# Patient Record
Sex: Female | Born: 1945 | Race: White | Hispanic: No | Marital: Married | Smoking: Former smoker
Health system: Southern US, Community
[De-identification: ages and names within clinical notes are randomized; demographics above are authoritative.]

## PROBLEM LIST (undated history)

## (undated) DIAGNOSIS — Z974 Presence of external hearing-aid: Secondary | ICD-10-CM

## (undated) DIAGNOSIS — D72820 Lymphocytosis (symptomatic): Secondary | ICD-10-CM

## (undated) DIAGNOSIS — C539 Malignant neoplasm of cervix uteri, unspecified: Secondary | ICD-10-CM

## (undated) HISTORY — PX: COLONOSCOPY: SHX174

## (undated) HISTORY — DX: Lymphocytosis (symptomatic): D72.820

---

## 1979-04-06 DIAGNOSIS — C539 Malignant neoplasm of cervix uteri, unspecified: Secondary | ICD-10-CM

## 1979-04-06 HISTORY — PX: APPENDECTOMY: SHX54

## 1979-04-06 HISTORY — DX: Malignant neoplasm of cervix uteri, unspecified: C53.9

## 1979-09-04 HISTORY — PX: ABDOMINAL HYSTERECTOMY: SHX81

## 2011-04-13 ENCOUNTER — Ambulatory Visit: Payer: Self-pay | Admitting: Obstetrics and Gynecology

## 2011-04-21 ENCOUNTER — Ambulatory Visit: Payer: Self-pay | Admitting: Internal Medicine

## 2011-04-28 ENCOUNTER — Ambulatory Visit: Payer: Self-pay | Admitting: Internal Medicine

## 2011-04-28 LAB — CBC CANCER CENTER
Basophil #: 0.1 x10 3/mm (ref 0.0–0.1)
Eosinophil %: 0.8 %
Lymphocyte #: 10 x10 3/mm — ABNORMAL HIGH (ref 1.0–3.6)
Lymphocyte %: 66.9 %
MCH: 31 pg (ref 26.0–34.0)
MCV: 92 fL (ref 80–100)
Monocyte #: 0.4 x10 3/mm (ref 0.0–0.7)
Monocyte %: 2.9 %
Neutrophil #: 4.3 x10 3/mm (ref 1.4–6.5)
Neutrophil %: 29 %
RBC: 4.54 10*6/uL (ref 3.80–5.20)
RDW: 13.2 % (ref 11.5–14.5)

## 2011-05-07 ENCOUNTER — Ambulatory Visit: Payer: Self-pay | Admitting: Internal Medicine

## 2011-06-07 ENCOUNTER — Ambulatory Visit: Payer: Self-pay | Admitting: Internal Medicine

## 2011-06-07 LAB — CBC CANCER CENTER
Basophil %: 0.4 %
HCT: 41.7 % (ref 35.0–47.0)
Lymphocyte %: 59.5 %
MCH: 31.4 pg (ref 26.0–34.0)
MCHC: 33.9 g/dL (ref 32.0–36.0)
Monocyte #: 0.6 x10 3/mm (ref 0.0–0.7)
Neutrophil #: 5.6 x10 3/mm (ref 1.4–6.5)
Neutrophil %: 35.6 %
RDW: 14.4 % (ref 11.5–14.5)

## 2011-06-08 LAB — PROT IMMUNOELECTROPHORES(ARMC)

## 2011-06-28 ENCOUNTER — Ambulatory Visit: Payer: Self-pay | Admitting: Unknown Physician Specialty

## 2011-07-05 ENCOUNTER — Ambulatory Visit: Payer: Self-pay

## 2011-07-05 ENCOUNTER — Ambulatory Visit: Payer: Self-pay | Admitting: Internal Medicine

## 2011-07-05 LAB — CBC CANCER CENTER
Basophil #: 0.1 x10 3/mm (ref 0.0–0.1)
Basophil %: 0.4 %
Eosinophil %: 1 %
HCT: 38.1 % (ref 35.0–47.0)
HGB: 12.9 g/dL (ref 12.0–16.0)
Lymphocyte #: 9.5 x10 3/mm — ABNORMAL HIGH (ref 1.0–3.6)
MCHC: 33.8 g/dL (ref 32.0–36.0)
MCV: 92 fL (ref 80–100)
Neutrophil %: 35.5 %
Platelet: 331 x10 3/mm (ref 150–440)
RDW: 13.4 % (ref 11.5–14.5)
WBC: 15.9 x10 3/mm — ABNORMAL HIGH (ref 3.6–11.0)

## 2011-08-04 ENCOUNTER — Ambulatory Visit: Payer: Self-pay | Admitting: Internal Medicine

## 2011-09-01 LAB — CBC CANCER CENTER
Basophil %: 0.5 %
Eosinophil #: 0.1 x10 3/mm (ref 0.0–0.7)
Eosinophil %: 0.9 %
HGB: 13.2 g/dL (ref 12.0–16.0)
Lymphocyte #: 8.7 x10 3/mm — ABNORMAL HIGH (ref 1.0–3.6)
Lymphocyte %: 55.9 %
Monocyte %: 3 %
Neutrophil #: 6.2 x10 3/mm (ref 1.4–6.5)
Platelet: 283 x10 3/mm (ref 150–440)
RBC: 4.37 10*6/uL (ref 3.80–5.20)
RDW: 13.4 % (ref 11.5–14.5)
WBC: 15.5 x10 3/mm — ABNORMAL HIGH (ref 3.6–11.0)

## 2011-09-04 ENCOUNTER — Ambulatory Visit: Payer: Self-pay | Admitting: Internal Medicine

## 2011-12-27 ENCOUNTER — Ambulatory Visit: Payer: Self-pay | Admitting: Internal Medicine

## 2011-12-27 LAB — CBC CANCER CENTER
Basophil %: 0.4 %
Eosinophil #: 0.2 x10 3/mm (ref 0.0–0.7)
Eosinophil %: 1.2 %
HGB: 13.5 g/dL (ref 12.0–16.0)
Lymphocyte #: 9.7 x10 3/mm — ABNORMAL HIGH (ref 1.0–3.6)
Lymphocyte %: 59.6 %
MCH: 30.2 pg (ref 26.0–34.0)
MCHC: 32.2 g/dL (ref 32.0–36.0)
MCV: 94 fL (ref 80–100)
Neutrophil #: 5.7 x10 3/mm (ref 1.4–6.5)
Neutrophil %: 35.3 %
Platelet: 272 x10 3/mm (ref 150–440)
RDW: 13.4 % (ref 11.5–14.5)

## 2011-12-27 LAB — CREATININE, SERUM
Creatinine: 0.97 mg/dL (ref 0.60–1.30)
EGFR (African American): >60
EGFR (Non-African Amer.): >60

## 2011-12-27 LAB — LACTATE DEHYDROGENASE: LDH: 179 U/L (ref 81–246)

## 2012-01-04 ENCOUNTER — Ambulatory Visit: Payer: Self-pay | Admitting: Internal Medicine

## 2012-03-06 ENCOUNTER — Ambulatory Visit: Payer: Self-pay | Admitting: Internal Medicine

## 2012-03-06 LAB — CBC CANCER CENTER
Basophil #: 0.1 x10 3/mm (ref 0.0–0.1)
Lymphocyte #: 9 x10 3/mm — ABNORMAL HIGH (ref 1.0–3.6)
Lymphocyte %: 61.4 %
MCH: 30.4 pg (ref 26.0–34.0)
Monocyte %: 2.8 %
Neutrophil #: 5 x10 3/mm (ref 1.4–6.5)
Neutrophil %: 34.3 %
Platelet: 286 x10 3/mm (ref 150–440)
RBC: 4.27 10*6/uL (ref 3.80–5.20)
RDW: 13 % (ref 11.5–14.5)

## 2012-03-06 LAB — CREATININE, SERUM
Creatinine: 0.94 mg/dL (ref 0.60–1.30)
EGFR (African American): >60
EGFR (Non-African Amer.): >60

## 2012-03-06 LAB — URIC ACID: Uric Acid: 3.7 mg/dL (ref 2.6–6.0)

## 2012-03-06 LAB — LACTATE DEHYDROGENASE: LDH: 177 U/L (ref 81–246)

## 2012-04-05 ENCOUNTER — Ambulatory Visit: Payer: Self-pay | Admitting: Internal Medicine

## 2012-06-03 ENCOUNTER — Ambulatory Visit: Payer: Self-pay | Admitting: Internal Medicine

## 2012-06-05 LAB — CBC CANCER CENTER
Eosinophil #: 0.2 x10 3/mm (ref 0.0–0.7)
Eosinophil %: 1.2 %
HCT: 43 % (ref 35.0–47.0)
Lymphocyte %: 63.2 %
MCV: 92 fL (ref 80–100)
Monocyte %: 3.6 %
Neutrophil #: 4.7 x10 3/mm (ref 1.4–6.5)
Neutrophil %: 31.5 %
Platelet: 320 x10 3/mm (ref 150–440)
RBC: 4.66 10*6/uL (ref 3.80–5.20)

## 2012-07-04 ENCOUNTER — Ambulatory Visit: Payer: Self-pay | Admitting: Internal Medicine

## 2012-07-26 ENCOUNTER — Ambulatory Visit: Payer: Self-pay | Admitting: Obstetrics and Gynecology

## 2012-09-04 ENCOUNTER — Ambulatory Visit: Payer: Self-pay | Admitting: Internal Medicine

## 2012-09-04 LAB — CBC CANCER CENTER
Eosinophil #: 0.1 x10 3/mm (ref 0.0–0.7)
Eosinophil %: 0.9 %
HGB: 13.3 g/dL (ref 12.0–16.0)
Lymphocyte #: 8.1 x10 3/mm — ABNORMAL HIGH (ref 1.0–3.6)
MCHC: 33 g/dL (ref 32.0–36.0)
MCV: 92 fL (ref 80–100)
Monocyte #: 0.5 x10 3/mm (ref 0.2–0.9)
Monocyte %: 3.2 %
Neutrophil #: 5.7 x10 3/mm (ref 1.4–6.5)
Platelet: 268 x10 3/mm (ref 150–440)

## 2012-09-04 LAB — URIC ACID: Uric Acid: 3.5 mg/dL (ref 2.6–6.0)

## 2012-09-04 LAB — CREATININE, SERUM
Creatinine: 0.95 mg/dL (ref 0.60–1.30)
EGFR (African American): >60

## 2012-09-04 LAB — LACTATE DEHYDROGENASE: LDH: 193 U/L (ref 81–246)

## 2012-10-03 ENCOUNTER — Ambulatory Visit: Payer: Self-pay | Admitting: Internal Medicine

## 2012-12-25 ENCOUNTER — Ambulatory Visit: Payer: Self-pay | Admitting: Internal Medicine

## 2012-12-25 LAB — CBC CANCER CENTER
Basophil #: 0 x10 3/mm (ref 0.0–0.1)
Basophil %: 0.3 %
Eosinophil #: 0.2 x10 3/mm (ref 0.0–0.7)
HCT: 40.5 % (ref 35.0–47.0)
HGB: 13.4 g/dL (ref 12.0–16.0)
Monocyte #: 0.5 x10 3/mm (ref 0.2–0.9)
Platelet: 288 x10 3/mm (ref 150–440)
WBC: 14.2 x10 3/mm — ABNORMAL HIGH (ref 3.6–11.0)

## 2013-01-03 ENCOUNTER — Ambulatory Visit: Payer: Self-pay | Admitting: Internal Medicine

## 2013-03-05 ENCOUNTER — Ambulatory Visit: Payer: Self-pay | Admitting: Internal Medicine

## 2013-03-05 LAB — CBC CANCER CENTER
Basophil #: 0.1 x10 3/mm (ref 0.0–0.1)
Basophil %: 0.4 %
Eosinophil #: 0.2 x10 3/mm (ref 0.0–0.7)
Eosinophil %: 1.6 %
Lymphocyte #: 8.9 x10 3/mm — ABNORMAL HIGH (ref 1.0–3.6)
MCH: 30.6 pg (ref 26.0–34.0)
MCHC: 33 g/dL (ref 32.0–36.0)
MCV: 93 fL (ref 80–100)
Monocyte #: 0.5 x10 3/mm (ref 0.2–0.9)
Neutrophil #: 5 x10 3/mm (ref 1.4–6.5)
Neutrophil %: 33.9 %
Platelet: 285 x10 3/mm (ref 150–440)
RBC: 4.5 10*6/uL (ref 3.80–5.20)

## 2013-03-05 LAB — CREATININE, SERUM: Creatinine: 0.95 mg/dL (ref 0.60–1.30)

## 2013-03-05 LAB — URIC ACID: Uric Acid: 3.9 mg/dL (ref 2.6–6.0)

## 2013-04-05 ENCOUNTER — Ambulatory Visit: Payer: Self-pay | Admitting: Internal Medicine

## 2013-06-04 ENCOUNTER — Ambulatory Visit: Payer: Self-pay | Admitting: Internal Medicine

## 2013-06-04 LAB — CBC CANCER CENTER
BASOS ABS: 0.1 x10 3/mm (ref 0.0–0.1)
Basophil %: 0.6 %
EOS PCT: 1 %
Eosinophil #: 0.1 x10 3/mm (ref 0.0–0.7)
HCT: 41.2 % (ref 35.0–47.0)
HGB: 13.3 g/dL (ref 12.0–16.0)
Lymphocyte #: 8.8 x10 3/mm — ABNORMAL HIGH (ref 1.0–3.6)
Lymphocyte %: 60.8 %
MCH: 29.9 pg (ref 26.0–34.0)
MCHC: 32.2 g/dL (ref 32.0–36.0)
MCV: 93 fL (ref 80–100)
MONO ABS: 0.5 x10 3/mm (ref 0.2–0.9)
MONOS PCT: 3.5 %
NEUTROS ABS: 4.9 x10 3/mm (ref 1.4–6.5)
Neutrophil %: 34.1 %
Platelet: 307 x10 3/mm (ref 150–440)
RBC: 4.44 10*6/uL (ref 3.80–5.20)
RDW: 13.9 % (ref 11.5–14.5)
WBC: 14.5 x10 3/mm — ABNORMAL HIGH (ref 3.6–11.0)

## 2013-06-04 LAB — CREATININE, SERUM
CREATININE: 0.97 mg/dL (ref 0.60–1.30)
EGFR (Non-African Amer.): 60 — ABNORMAL LOW

## 2013-06-04 LAB — URIC ACID: Uric Acid: 4 mg/dL (ref 2.6–6.0)

## 2013-06-04 LAB — LACTATE DEHYDROGENASE: LDH: 332 U/L — ABNORMAL HIGH (ref 81–246)

## 2013-07-04 ENCOUNTER — Ambulatory Visit: Payer: Self-pay | Admitting: Internal Medicine

## 2013-07-09 LAB — CBC CANCER CENTER
BASOS ABS: 0.1 x10 3/mm (ref 0.0–0.1)
BASOS PCT: 0.5 %
Eosinophil #: 0.1 x10 3/mm (ref 0.0–0.7)
Eosinophil %: 0.8 %
HCT: 42.4 % (ref 35.0–47.0)
HGB: 13.8 g/dL (ref 12.0–16.0)
LYMPHS ABS: 9.9 x10 3/mm — AB (ref 1.0–3.6)
Lymphocyte %: 63 %
MCH: 30.4 pg (ref 26.0–34.0)
MCHC: 32.7 g/dL (ref 32.0–36.0)
MCV: 93 fL (ref 80–100)
Monocyte #: 0.5 x10 3/mm (ref 0.2–0.9)
Monocyte %: 3.1 %
Neutrophil #: 5.1 x10 3/mm (ref 1.4–6.5)
Neutrophil %: 32.6 %
PLATELETS: 302 x10 3/mm (ref 150–440)
RBC: 4.56 10*6/uL (ref 3.80–5.20)
RDW: 13.3 % (ref 11.5–14.5)
WBC: 15.7 x10 3/mm — ABNORMAL HIGH (ref 3.6–11.0)

## 2013-07-09 LAB — LACTATE DEHYDROGENASE: LDH: 175 U/L (ref 81–246)

## 2013-07-09 LAB — CREATININE, SERUM
CREATININE: 0.92 mg/dL (ref 0.60–1.30)
EGFR (Non-African Amer.): >60

## 2013-07-09 LAB — URIC ACID: Uric Acid: 3.6 mg/dL (ref 2.6–6.0)

## 2013-08-03 ENCOUNTER — Ambulatory Visit: Payer: Self-pay | Admitting: Internal Medicine

## 2013-10-08 ENCOUNTER — Ambulatory Visit: Payer: Self-pay | Admitting: Internal Medicine

## 2013-10-08 LAB — CREATININE, SERUM
Creatinine: 0.88 mg/dL (ref 0.60–1.30)
EGFR (Non-African Amer.): >60

## 2013-10-08 LAB — CBC CANCER CENTER
Basophil #: 0.1 x10 3/mm (ref 0.0–0.1)
Basophil %: 0.5 %
Eosinophil #: 0.1 x10 3/mm (ref 0.0–0.7)
Eosinophil %: 0.6 %
HCT: 39.5 % (ref 35.0–47.0)
HGB: 13 g/dL (ref 12.0–16.0)
LYMPHS ABS: 7.3 x10 3/mm — AB (ref 1.0–3.6)
Lymphocyte %: 55.3 %
MCH: 30.6 pg (ref 26.0–34.0)
MCHC: 32.8 g/dL (ref 32.0–36.0)
MCV: 93 fL (ref 80–100)
MONOS PCT: 3.5 %
Monocyte #: 0.5 x10 3/mm (ref 0.2–0.9)
NEUTROS PCT: 40.1 %
Neutrophil #: 5.3 x10 3/mm (ref 1.4–6.5)
PLATELETS: 271 x10 3/mm (ref 150–440)
RBC: 4.24 10*6/uL (ref 3.80–5.20)
RDW: 13.4 % (ref 11.5–14.5)
WBC: 13.1 x10 3/mm — ABNORMAL HIGH (ref 3.6–11.0)

## 2013-10-08 LAB — URIC ACID: URIC ACID: 4.1 mg/dL (ref 2.6–6.0)

## 2013-10-08 LAB — LACTATE DEHYDROGENASE: LDH: 178 U/L (ref 81–246)

## 2013-11-03 ENCOUNTER — Ambulatory Visit: Payer: Self-pay | Admitting: Internal Medicine

## 2013-12-04 ENCOUNTER — Ambulatory Visit: Payer: Self-pay | Admitting: Internal Medicine

## 2014-02-18 ENCOUNTER — Ambulatory Visit: Payer: Self-pay | Admitting: Internal Medicine

## 2014-02-18 LAB — URIC ACID: URIC ACID: 3.4 mg/dL (ref 2.6–6.0)

## 2014-02-18 LAB — LACTATE DEHYDROGENASE: LDH: 162 U/L (ref 81–246)

## 2014-02-18 LAB — CBC CANCER CENTER
Basophil #: 0.1 x10 3/mm (ref 0.0–0.1)
Basophil %: 0.6 %
EOS PCT: 1.2 %
Eosinophil #: 0.2 x10 3/mm (ref 0.0–0.7)
HCT: 43.4 % (ref 35.0–47.0)
HGB: 14.1 g/dL (ref 12.0–16.0)
Lymphocyte #: 8.4 x10 3/mm — ABNORMAL HIGH (ref 1.0–3.6)
Lymphocyte %: 59.8 %
MCH: 30.7 pg (ref 26.0–34.0)
MCHC: 32.5 g/dL (ref 32.0–36.0)
MCV: 95 fL (ref 80–100)
Monocyte #: 0.6 x10 3/mm (ref 0.2–0.9)
Monocyte %: 4.5 %
NEUTROS PCT: 33.9 %
Neutrophil #: 4.8 x10 3/mm (ref 1.4–6.5)
Platelet: 280 x10 3/mm (ref 150–440)
RBC: 4.59 10*6/uL (ref 3.80–5.20)
RDW: 13.1 % (ref 11.5–14.5)
WBC: 14.1 x10 3/mm — ABNORMAL HIGH (ref 3.6–11.0)

## 2014-02-18 LAB — CREATININE, SERUM
Creatinine: 0.81 mg/dL (ref 0.60–1.30)
EGFR (Non-African Amer.): >60

## 2014-03-05 ENCOUNTER — Ambulatory Visit: Payer: Self-pay | Admitting: Internal Medicine

## 2014-06-10 ENCOUNTER — Ambulatory Visit
Admit: 2014-06-10 | Disposition: A | Discharge status: Home / Self Care | Payer: Self-pay | Attending: Internal Medicine | Admitting: Internal Medicine

## 2014-07-05 ENCOUNTER — Ambulatory Visit
Admit: 2014-07-05 | Disposition: A | Discharge status: Home / Self Care | Payer: Self-pay | Attending: Internal Medicine | Admitting: Internal Medicine

## 2014-07-19 ENCOUNTER — Other Ambulatory Visit: Payer: Self-pay | Admitting: Internal Medicine

## 2014-07-19 DIAGNOSIS — Z1231 Encounter for screening mammogram for malignant neoplasm of breast: Secondary | ICD-10-CM

## 2014-10-09 ENCOUNTER — Other Ambulatory Visit: Payer: Self-pay

## 2014-10-09 DIAGNOSIS — C9110 Chronic lymphocytic leukemia of B-cell type not having achieved remission: Secondary | ICD-10-CM | POA: Insufficient documentation

## 2014-10-10 ENCOUNTER — Inpatient Hospital Stay: Payer: Medicare Other | Attending: Family Medicine

## 2014-10-10 DIAGNOSIS — C9110 Chronic lymphocytic leukemia of B-cell type not having achieved remission: Secondary | ICD-10-CM | POA: Insufficient documentation

## 2014-10-10 DIAGNOSIS — D72829 Elevated white blood cell count, unspecified: Secondary | ICD-10-CM | POA: Insufficient documentation

## 2014-10-11 ENCOUNTER — Inpatient Hospital Stay: Payer: Medicare Other

## 2014-10-11 DIAGNOSIS — D72829 Elevated white blood cell count, unspecified: Secondary | ICD-10-CM | POA: Diagnosis not present

## 2014-10-11 DIAGNOSIS — C9110 Chronic lymphocytic leukemia of B-cell type not having achieved remission: Secondary | ICD-10-CM | POA: Diagnosis present

## 2014-10-11 LAB — CBC
HEMATOCRIT: 42.6 % (ref 35.0–47.0)
Hemoglobin: 14 g/dL (ref 12.0–16.0)
MCH: 30.2 pg (ref 26.0–34.0)
MCHC: 32.9 g/dL (ref 32.0–36.0)
MCV: 91.8 fL (ref 80.0–100.0)
PLATELETS: 286 10*3/uL (ref 150–440)
RBC: 4.64 MIL/uL (ref 3.80–5.20)
RDW: 13.3 % (ref 11.5–14.5)
WBC: 13.7 10*3/uL — ABNORMAL HIGH (ref 3.6–11.0)

## 2014-10-11 LAB — LACTATE DEHYDROGENASE: LDH: 156 U/L (ref 98–192)

## 2014-10-11 LAB — CREATININE, SERUM
Creatinine, Ser: 0.87 mg/dL (ref 0.44–1.00)
GFR calc Af Amer: >60 mL/min (ref >60)

## 2014-11-08 ENCOUNTER — Encounter (HOSPITAL_COMMUNITY): Payer: Self-pay

## 2014-11-08 ENCOUNTER — Ambulatory Visit
Admission: RE | Admit: 2014-11-08 | Discharge: 2014-11-08 | Disposition: A | Discharge status: Home / Self Care | Payer: Medicare Other | Source: Ambulatory Visit | Attending: Internal Medicine | Admitting: Internal Medicine

## 2014-11-08 ENCOUNTER — Other Ambulatory Visit: Payer: Self-pay | Admitting: Internal Medicine

## 2014-11-08 DIAGNOSIS — Z1231 Encounter for screening mammogram for malignant neoplasm of breast: Secondary | ICD-10-CM | POA: Diagnosis not present

## 2014-11-08 HISTORY — DX: Malignant neoplasm of cervix uteri, unspecified: C53.9

## 2015-02-17 ENCOUNTER — Inpatient Hospital Stay: Payer: Medicare Other | Attending: Internal Medicine | Admitting: Internal Medicine

## 2015-02-17 ENCOUNTER — Ambulatory Visit: Payer: Self-pay | Admitting: Internal Medicine

## 2015-02-17 ENCOUNTER — Other Ambulatory Visit: Payer: Self-pay

## 2015-02-17 ENCOUNTER — Inpatient Hospital Stay: Payer: Medicare Other | Admitting: *Deleted

## 2015-02-17 VITALS — BP 121/82 | HR 69 | Temp 97.4°F | Resp 18 | Ht 63.78 in | Wt 132.3 lb

## 2015-02-17 DIAGNOSIS — C9110 Chronic lymphocytic leukemia of B-cell type not having achieved remission: Secondary | ICD-10-CM | POA: Insufficient documentation

## 2015-02-17 DIAGNOSIS — Z79899 Other long term (current) drug therapy: Secondary | ICD-10-CM | POA: Diagnosis not present

## 2015-02-17 DIAGNOSIS — Z87891 Personal history of nicotine dependence: Secondary | ICD-10-CM | POA: Diagnosis not present

## 2015-02-17 DIAGNOSIS — Z8541 Personal history of malignant neoplasm of cervix uteri: Secondary | ICD-10-CM | POA: Diagnosis not present

## 2015-02-17 DIAGNOSIS — Z23 Encounter for immunization: Secondary | ICD-10-CM | POA: Diagnosis not present

## 2015-02-17 LAB — CBC WITH DIFFERENTIAL/PLATELET
Basophils Absolute: 0.1 10*3/uL (ref 0–0.1)
Basophils Relative: 1 %
EOS PCT: 2 %
Eosinophils Absolute: 0.2 10*3/uL (ref 0–0.7)
HCT: 42.6 % (ref 35.0–47.0)
Hemoglobin: 14 g/dL (ref 12.0–16.0)
LYMPHS PCT: 62 %
Lymphs Abs: 8.4 10*3/uL — ABNORMAL HIGH (ref 1.0–3.6)
MCH: 30.3 pg (ref 26.0–34.0)
MCHC: 32.9 g/dL (ref 32.0–36.0)
MCV: 92.1 fL (ref 80.0–100.0)
MONO ABS: 0.4 10*3/uL (ref 0.2–0.9)
MONOS PCT: 3 %
Neutro Abs: 4.3 10*3/uL (ref 1.4–6.5)
Neutrophils Relative %: 32 %
PLATELETS: 308 10*3/uL (ref 150–440)
RBC: 4.63 MIL/uL (ref 3.80–5.20)
RDW: 13.5 % (ref 11.5–14.5)
WBC: 13.5 10*3/uL — AB (ref 3.6–11.0)

## 2015-02-17 LAB — LACTATE DEHYDROGENASE: LDH: 146 U/L (ref 98–192)

## 2015-02-17 LAB — CREATININE, SERUM
Creatinine, Ser: 1.12 mg/dL — ABNORMAL HIGH (ref 0.44–1.00)
GFR calc Af Amer: 56 mL/min — ABNORMAL LOW (ref >60)
GFR calc non Af Amer: 48 mL/min — ABNORMAL LOW (ref >60)

## 2015-02-17 LAB — URIC ACID: Uric Acid, Serum: 5.4 mg/dL (ref 2.3–6.6)

## 2015-02-17 MED ORDER — INFLUENZA VAC SPLIT QUAD 0.5 ML IM SUSY
0.5000 mL | PREFILLED_SYRINGE | Freq: Once | INTRAMUSCULAR | Status: AC
  Administered 2015-02-17: 0.5 mL via INTRAMUSCULAR
  Filled 2015-02-17: qty 0.5

## 2015-02-17 NOTE — Progress Notes (Signed)
[place] @ [facility] Telephone:(336) [phone]  Fax:(336) [place] OB: 06/20/1945  MR#: [id number]  OEH#:[id number]  Patient Care Team: Madelyn Brunner, MD as PCP - General (Internal Medicine)  CHIEF COMPLAINT: Rai Stage 0 chronic lymphocytic leukemia  Chief Complaint  Patient presents with  . lymphocytosis   HPI: Ms. Suzanne Hogan was found to have elevated white blood cell count in 2013. Further workup revealed presence of monoclonal B- cell  population on flow cytometry. Patient was diagnosed with stage 0 CLL in the absence of palpable lymphadenopathy, hepatosplenomegaly or anemia and thrombocytopenia. She has been on observation since then.   INTERVAL HISTORY:  Suzanne Hogan returns for a follow-up appointment. Since her last appointment she has felt fairly well, but has been feeling somewhat less active, and underwent to do much activities. She, however, denies chest pain, night sweats, shortness of breath, decreased appetite, nausea, vomiting, diarrhea, constipation, suicidal homicidal ideations.   REVIEW OF SYSTEMS:    As per HPI. Otherwise, a complete review of systems is negatve.  PAST MEDICAL HISTORY: Past Medical History  Diagnosis Date  . Cervical cancer ([place]) 1981  . Lymphocytosis     PAST SURGICAL HISTORY: Past Surgical History  Procedure Laterality Date  . Abdominal hysterectomy  09/1979    [facility]    FAMILY HISTORY History reviewed. No pertinent family history.  ADVANCED DIRECTIVES:  No flowsheet data found.  HEALTH MAINTENANCE: Social History  Substance Use Topics  . Smoking status: Former Smoker -- 10 years    Quit date: 06/02/2002  . Smokeless tobacco: Never Used  . Alcohol Use: Yes     Comment: 1 GLASS  5 TIMES A WEEK      Allergies  Allergen Reactions  . Codeine Diarrhea and Nausea And Vomiting  . Sulfa Antibiotics Rash    Current Outpatient Prescriptions  Medication Sig Dispense Refill  . Calcium  Carb-Cholecalciferol (CALCIUM + D3 PO) Take 1 Dose by mouth 2 (two) times daily. 900 mg AND VITAMIN D 1200. 1 IN AMD AN 1/2 IN PM    . cholecalciferol (VITAMIN D) 1000 UNITS tablet Take 1,000 Units by mouth daily.    . Multiple Vitamins-Minerals (MULTIVITAMIN WITH MINERALS) tablet Take 1 tablet by mouth daily.    . Omega 3 1200 MG CAPS Take 1 capsule by mouth.    . vitamin C (ASCORBIC ACID) 500 MG tablet Take 500 mg by mouth daily.    . Zinc 25 MG TABS Take 1 tablet by mouth.     No current facility-administered medications for this visit.    OBJECTIVE:  Filed Vitals:   02/17/15 1459  BP: 121/82  Pulse: 69  Temp: 97.4 F (36.3 C)  Resp: 18     Body mass index is 22.86 kg/(m^2).    ECOG FS:1 - Symptomatic but completely ambulatory  PHYSICAL EXAM: BP 121/82 mmHg  Pulse 69  Temp(Src) 97.4 F (36.3 C) (Tympanic)  Resp 18  Ht 5' 3.78" (1.62 m)  Wt 132 lb 4.4 oz (60 kg)  BMI 22.86 kg/m2  General Appearance:    Alert, cooperative, no distress, appears  younger than stated age 69 female with normal affect and stable mood   Head:    Normocephalic, without obvious abnormality, atraumatic  Eyes:    PERRL, conjunctiva/corneas clear, EOM's intact, fundi    benign, both eyes  Ears:    Normal TM's and external ear canals, both ears  Nose:   Nares  normal, septum midline, mucosa normal, no drainage    or sinus tenderness  Throat:   Lips, mucosa, and tongue normal; teeth and gums normal  Neck:   Supple, symmetrical, trachea midline, no adenopathy;    thyroid:  no enlargement/tenderness/nodules; no carotid   bruit or JVD  Back:     Symmetric, no curvature, ROM normal, no CVA tenderness  Lungs:     Clear to auscultation bilaterally, respirations unlabored  Chest Wall:    No tenderness or deformity   Heart:    Regular rate and rhythm, S1 and S2 normal, no murmur, rub   or gallop  Breast Exam:    No tenderness, masses, or nipple abnormality  Abdomen:     Soft, non-tender, bowel  sounds active all four quadrants,    no masses, no organomegaly     Rectal:    deferred by the patient   Extremities:   Extremities normal, atraumatic, no cyanosis or edema  Pulses:   2+ and symmetric all extremities  Skin:   Skin color, texture, turgor normal, no rashes or lesions  Lymph nodes:   Cervical, supraclavicular, and axillary nodes normal  Neurologic:   CNII-XII intact, normal strength, sensation and reflexes    throughout      LAB RESULTS:  CBC Latest Ref Rng 02/17/2015 10/11/2014  WBC 3.6 - 11.0 K/uL 13.5(H) 13.7(H)  Hemoglobin 12.0 - 16.0 g/dL 14.0 14.0  Hematocrit 35.0 - 47.0 % 42.6 42.6  Platelets 150 - 440 K/uL 308 286    Clinical Support on 02/17/2015  Component Date Value Ref Range Status  . Uric Acid, Serum 02/17/2015 5.4  2.3 - 6.6 mg/dL Final  . WBC 02/17/2015 13.5* 3.6 - 11.0 K/uL Final  . RBC 02/17/2015 4.63  3.80 - 5.20 MIL/uL Final  . Hemoglobin 02/17/2015 14.0  12.0 - 16.0 g/dL Final  . HCT 02/17/2015 42.6  35.0 - 47.0 % Final  . MCV 02/17/2015 92.1  80.0 - 100.0 fL Final  . MCH 02/17/2015 30.3  26.0 - 34.0 pg Final  . MCHC 02/17/2015 32.9  32.0 - 36.0 g/dL Final  . RDW 02/17/2015 13.5  11.5 - 14.5 % Final  . Platelets 02/17/2015 308  150 - 440 K/uL Final  . Neutrophils Relative % 02/17/2015 32   Final  . Neutro Abs 02/17/2015 4.3  1.4 - 6.5 K/uL Final  . Lymphocytes Relative 02/17/2015 62   Final  . Lymphs Abs 02/17/2015 8.4* 1.0 - 3.6 K/uL Final  . Monocytes Relative 02/17/2015 3   Final  . Monocytes Absolute 02/17/2015 0.4  0.2 - 0.9 K/uL Final  . Eosinophils Relative 02/17/2015 2   Final  . Eosinophils Absolute 02/17/2015 0.2  0 - 0.7 K/uL Final  . Basophils Relative 02/17/2015 1   Final  . Basophils Absolute 02/17/2015 0.1  0 - 0.1 K/uL Final  . Creatinine, Ser 02/17/2015 1.12* 0.44 - 1.00 mg/dL Final  . GFR calc non Af Amer 02/17/2015 48* >60 mL/min Final  . GFR calc Af Amer 02/17/2015 56* >60 mL/min Final   Comment: (NOTE) The eGFR has  been calculated using the CKD EPI equation. This calculation has not been validated in all clinical situations. eGFR's persistently <60 mL/min signify possible Chronic Kidney Disease.   Marland Kitchen LDH 02/17/2015 146  98 - 192 U/L Final    STUDIES: No results found.  ASSESSMENT AND PLAN: 1. Rai stage 0 chronic lymphocytic leukemia-we had an extensive discussion with the patient regarding the significance of the diagnosis,  along with the expectations regarding clinical course, expected complications and outcomes. According to the original publication of Rai stage and system patients with stage 0 disease had median survival of 150 months. In order to clearly establish perceived excellent prognosis for this patient we will request peripheral blood FISH panel to be performed, which will help [place] clearly establish the important prognostic features of her disease. However, it appears that over the past 3 years her white blood cell count has remained remarkably stable, and absolute lymphocyte count has not changed, which likely indicates a very low risk disease. As such, we agreed that at this point there is no reason to perform frequent CBC checks and clinical exams, but rather repeat blood counts every 6 months and see the patient on an annual basis. She, however, was advised to perform self exam once a month to look for enlarged lymph nodes. We will also check quantitative immunoglobulin levels to ensure that there is no potential for increased risk of bacterial infections due to possible hypogammaglobulinemia. 2. Decreased energy level-there could be several reasons for such change in her condition, including thyroid insufficiency, depression, chronic illness. She will discuss this issues with her primary care physician. At this time there is no sign of serious somatic illness, and patient does not endorse any signs of depression. 3. Health maintenance-patient is up-to-date with colonoscopy, mammogram. We will  administer flu vaccine today in the clinic.   Patient expressed understanding and was in agreement with this plan. She also understands that She can call clinic at any time with any questions, concerns, or complaints.    No matching staging information was found for the patient.  Roxana Hires, MD   02/17/2015 4:00 PM

## 2015-02-17 NOTE — Progress Notes (Signed)
PT STATES THAT SHE DOES NOT FEEL MUCH DIFFERENT SHE JUST DOES NOT HAVE THE DESIRE TO DO ACTIVITY LIKE CLEANING.  BUT SHE IS RETIRED, HAS NO ROUTINE THAT SHE FOLLOWS.  SHE DOES MENTION THAT SHE STOPPED HER PREMARIN LAST AUG.

## 2015-02-21 LAB — IGG, IGA, IGM
IGA: 175 mg/dL (ref 64–422)
IGG (IMMUNOGLOBIN G), SERUM: 838 mg/dL (ref 700–1600)
IgM, Serum: 139 mg/dL (ref 26–217)

## 2015-02-21 LAB — FISH HES LEUKEMIA, 4Q12 REA

## 2015-03-28 ENCOUNTER — Telehealth: Payer: Self-pay | Admitting: *Deleted

## 2015-03-28 NOTE — Telephone Encounter (Signed)
Pt had called to get her genetic results.  I checked her FISH panel and it showed CLL which she already knew but the md wanted to check it to see what deletion it was so he can determine if she had an aggressive kind.  The pt. 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  It has the best longest survival of any of the rest of the deletions.  I have called her on the cell phone that she left for [place] but there was no answer. I left the message that it was the best prognostic deletion for CLL and she should have a long life per Dr. Rudean Hitt.I also left message that she can call me back and ask questions and that I have mailed her a copy of results.

## 2015-08-18 ENCOUNTER — Inpatient Hospital Stay: Payer: Medicare Other

## 2015-08-18 ENCOUNTER — Inpatient Hospital Stay: Payer: Medicare Other | Attending: Family Medicine | Admitting: Family Medicine

## 2015-08-18 VITALS — BP 120/79 | HR 80 | Temp 96.8°F | Resp 18 | Wt 130.3 lb

## 2015-08-18 DIAGNOSIS — C9110 Chronic lymphocytic leukemia of B-cell type not having achieved remission: Secondary | ICD-10-CM

## 2015-08-18 DIAGNOSIS — C9111 Chronic lymphocytic leukemia of B-cell type in remission: Secondary | ICD-10-CM | POA: Diagnosis present

## 2015-08-18 DIAGNOSIS — Z87891 Personal history of nicotine dependence: Secondary | ICD-10-CM | POA: Diagnosis not present

## 2015-08-18 DIAGNOSIS — Z8541 Personal history of malignant neoplasm of cervix uteri: Secondary | ICD-10-CM | POA: Diagnosis not present

## 2015-08-18 DIAGNOSIS — Z79899 Other long term (current) drug therapy: Secondary | ICD-10-CM

## 2015-08-18 LAB — CBC WITH DIFFERENTIAL/PLATELET
BASOS PCT: 1 %
Basophils Absolute: 0.1 10*3/uL (ref 0–0.1)
EOS ABS: 0.1 10*3/uL (ref 0–0.7)
EOS PCT: 1 %
HCT: 40.7 % (ref 35.0–47.0)
HEMOGLOBIN: 13.7 g/dL (ref 12.0–16.0)
LYMPHS ABS: 9.1 10*3/uL — AB (ref 1.0–3.6)
Lymphocytes Relative: 60 %
MCH: 30.7 pg (ref 26.0–34.0)
MCHC: 33.6 g/dL (ref 32.0–36.0)
MCV: 91.3 fL (ref 80.0–100.0)
MONOS PCT: 3 %
Monocytes Absolute: 0.4 10*3/uL (ref 0.2–0.9)
NEUTROS PCT: 35 %
Neutro Abs: 5.2 10*3/uL (ref 1.4–6.5)
Platelets: 286 10*3/uL (ref 150–440)
RBC: 4.46 MIL/uL (ref 3.80–5.20)
RDW: 13.7 % (ref 11.5–14.5)
WBC: 14.9 10*3/uL — AB (ref 3.6–11.0)

## 2015-08-18 NOTE — Progress Notes (Signed)
Offers no complaints. States is feeling well. 

## 2015-08-18 NOTE — Progress Notes (Signed)
[place] @ [facility] Telephone:(336) [phone]  Fax:(336) [place] OB: [birth year]  MR#: [id number]  [id number]  Patient Care Team: Madelyn Brunner, MD as PCP - General (Internal Medicine)  CHIEF COMPLAINT: Rai Stage 0 chronic lymphocytic leukemia  Chief Complaint  Patient presents with  . CLL   HPI: Ms. Suzanne Hogan was found to have elevated white blood cell count in 2013. Further workup revealed presence of monoclonal B- cell  population on flow cytometry. Patient was diagnosed with stage 0 CLL in the absence of palpable lymphadenopathy, hepatosplenomegaly or anemia and thrombocytopenia. She has been on observation since then.   INTERVAL HISTORY:  She is here for further evaluation regarding CLL. Patient was previously seen by Dr. Inez Pilgrim, then Dr Rudean Hitt. Since her last appointment she has felt fairly well. She denies chest pain, night sweats, shortness of breath, decreased appetite, nausea, vomiting, diarrhea, constipation, or any lumps, bumps or unusual nodules. She continues with routine evaluation with PCP. Overall she is feeling very well and denies any acute complaints.  REVIEW OF SYSTEMS:   As per HPI. Otherwise, a complete review of systems is negatve.  PAST MEDICAL HISTORY: Past Medical History  Diagnosis Date  . Cervical cancer ([place]) 1981  . Lymphocytosis     PAST SURGICAL HISTORY: Past Surgical History  Procedure Laterality Date  . Abdominal hysterectomy  09/1979    [facility]    FAMILY HISTORY Family History  Problem Relation Age of Onset  . Cancer Mother     ADVANCED DIRECTIVES:  No flowsheet data found.  HEALTH MAINTENANCE: Social History  Substance Use Topics  . Smoking status: Former Smoker -- 10 years    Quit date: 06/02/2002  . Smokeless tobacco: Never Used  . Alcohol Use: Yes     Comment: 1 GLASS  5 TIMES A WEEK      Allergies  Allergen Reactions  . Codeine Diarrhea and Nausea And Vomiting  . Sulfa  Antibiotics Rash    Current Outpatient Prescriptions  Medication Sig Dispense Refill  . Calcium Carb-Cholecalciferol (CALCIUM + D3 PO) Take 1 Dose by mouth 2 (two) times daily. 900 mg AND VITAMIN D 1200. 1 IN AMD AN 1/2 IN PM    . cholecalciferol (VITAMIN D) 1000 UNITS tablet Take 1,000 Units by mouth daily.    . Multiple Vitamins-Minerals (MULTIVITAMIN WITH MINERALS) tablet Take 1 tablet by mouth daily.    . Omega 3 1200 MG CAPS Take 1 capsule by mouth.    . vitamin C (ASCORBIC ACID) 500 MG tablet Take 500 mg by mouth daily.    . Zinc 25 MG TABS Take 1 tablet by mouth.     No current facility-administered medications for this visit.    OBJECTIVE:  Filed Vitals:   08/18/15 1516  BP: 120/79  Pulse: 80  Temp: 96.8 F (36 C)  Resp: 18     Body mass index is 22.52 kg/(m^2).    ECOG FS:1 - Symptomatic but completely ambulatory  PHYSICAL EXAM: BP 120/79 mmHg  Pulse 80  Temp(Src) 96.8 F (36 C) (Tympanic)  Resp 18  Wt 130 lb 4.7 oz (59.1 kg)  General Appearance:    Alert, cooperative, no distress, appears  younger than stated age 4 female with normal affect and stable mood   Head:    Normocephalic, without obvious abnormality, atraumatic  Eyes:    PERRL, conjunctiva/corneas clear, EOM's intact, fundi    benign, both eyes  Ears:  Normal TM's and external ear canals, both ears  Nose:   Nares normal, septum midline, mucosa normal, no drainage    or sinus tenderness  Throat:   Lips, mucosa, and tongue normal; teeth and gums normal  Neck:   Supple, symmetrical, trachea midline, no adenopathy;    thyroid:  no enlargement/tenderness/nodules; no carotid   bruit or JVD  Back:     Symmetric, no curvature, ROM normal, no CVA tenderness  Lungs:     Clear to auscultation bilaterally, respirations unlabored  Chest Wall:    No tenderness or deformity   Heart:    Regular rate and rhythm, S1 and S2 normal, no murmur, rub   or gallop  Breast Exam:    No tenderness, masses, or  nipple abnormality  Abdomen:     Soft, non-tender, bowel sounds active all four quadrants,    no masses, no organomegaly     Rectal:    deferred by the patient   Extremities:   Extremities normal, atraumatic, no cyanosis or edema  Pulses:   2+ and symmetric all extremities  Skin:   Skin color, texture, turgor normal, no rashes or lesions  Lymph nodes:   Cervical, supraclavicular, and axillary nodes normal  Neurologic:   CNII-XII intact, normal strength, sensation and reflexes    throughout      LAB RESULTS:  CBC Latest Ref Rng 08/18/2015 02/17/2015  WBC 3.6 - 11.0 K/uL 14.9(H) 13.5(H)  Hemoglobin 12.0 - 16.0 g/dL 13.7 14.0  Hematocrit 35.0 - 47.0 % 40.7 42.6  Platelets 150 - 440 K/uL 286 308    Appointment on 08/18/2015  Component Date Value Ref Range Status  . WBC 08/18/2015 14.9* 3.6 - 11.0 K/uL Final  . RBC 08/18/2015 4.46  3.80 - 5.20 MIL/uL Final  . Hemoglobin 08/18/2015 13.7  12.0 - 16.0 g/dL Final  . HCT 08/18/2015 40.7  35.0 - 47.0 % Final  . MCV 08/18/2015 91.3  80.0 - 100.0 fL Final  . MCH 08/18/2015 30.7  26.0 - 34.0 pg Final  . MCHC 08/18/2015 33.6  32.0 - 36.0 g/dL Final  . RDW 08/18/2015 13.7  11.5 - 14.5 % Final  . Platelets 08/18/2015 286  150 - 440 K/uL Final  . Neutrophils Relative % 08/18/2015 35   Final  . Neutro Abs 08/18/2015 5.2  1.4 - 6.5 K/uL Final  . Lymphocytes Relative 08/18/2015 60   Final  . Lymphs Abs 08/18/2015 9.1* 1.0 - 3.6 K/uL Final  . Monocytes Relative 08/18/2015 3   Final  . Monocytes Absolute 08/18/2015 0.4  0.2 - 0.9 K/uL Final  . Eosinophils Relative 08/18/2015 1   Final  . Eosinophils Absolute 08/18/2015 0.1  0 - 0.7 K/uL Final  . Basophils Relative 08/18/2015 1   Final  . Basophils Absolute 08/18/2015 0.1  0 - 0.1 K/uL Final    STUDIES: No results found.  ASSESSMENT AND PLAN: 1. Rai stage 0 chronic lymphocytic leukemia. Clinically patient appears to be doing very well and has no signs of progressive disease at this time.  WBC 14.9, lymphocytes 9.1-clinically stable. It appears that over the past 3 years her white blood cell count has remained remarkably stable, and absolute lymphocyte count has not changed, which likely indicates a very low risk disease. We will continue with evaluation and repeat blood counts every 6 months. She, however, was advised to perform self exam once a month to look for enlarged lymph nodes. Results were normal for quantitative immunoglobulin levels. 2. Health  maintenance. Patient is up-to-date with colonoscopy, mammogram. She follows closely with primary care provider.  Patient expressed understanding and was in agreement with this plan. She also understands that She can call clinic at any time with any questions, concerns, or complaints.   Dr. Rogue Bussing was available for consultation and review of plan of care for this patient.  Evlyn Kanner, NP   08/18/2015 3:41 PM

## 2015-11-06 ENCOUNTER — Other Ambulatory Visit: Payer: Self-pay | Admitting: Internal Medicine

## 2015-11-06 ENCOUNTER — Other Ambulatory Visit: Payer: Self-pay | Admitting: Family Medicine

## 2015-12-01 ENCOUNTER — Telehealth: Payer: Self-pay | Admitting: *Deleted

## 2015-12-01 NOTE — Telephone Encounter (Signed)
Per Dr Rogue Bussing, have PCP order this, patient In agreemant with this

## 2015-12-01 NOTE — Telephone Encounter (Signed)
Needs an order for mammogram to be scheduled. Please let her know when scheduled

## 2016-03-03 ENCOUNTER — Inpatient Hospital Stay: Payer: Medicare Other | Attending: Internal Medicine | Admitting: Internal Medicine

## 2016-03-03 ENCOUNTER — Inpatient Hospital Stay: Payer: Medicare Other

## 2016-03-03 VITALS — BP 129/85 | HR 73 | Temp 97.3°F | Resp 18 | Wt 132.0 lb

## 2016-03-03 DIAGNOSIS — Z87891 Personal history of nicotine dependence: Secondary | ICD-10-CM | POA: Insufficient documentation

## 2016-03-03 DIAGNOSIS — C9110 Chronic lymphocytic leukemia of B-cell type not having achieved remission: Secondary | ICD-10-CM | POA: Diagnosis not present

## 2016-03-03 DIAGNOSIS — Z8541 Personal history of malignant neoplasm of cervix uteri: Secondary | ICD-10-CM | POA: Insufficient documentation

## 2016-03-03 LAB — COMPREHENSIVE METABOLIC PANEL
ALBUMIN: 4.2 g/dL (ref 3.5–5.0)
ALT: 19 U/L (ref 14–54)
ANION GAP: 8 (ref 5–15)
AST: 25 U/L (ref 15–41)
Alkaline Phosphatase: 62 U/L (ref 38–126)
BUN: 23 mg/dL — ABNORMAL HIGH (ref 6–20)
CO2: 26 mmol/L (ref 22–32)
Calcium: 9.1 mg/dL (ref 8.9–10.3)
Chloride: 103 mmol/L (ref 101–111)
Creatinine, Ser: 0.87 mg/dL (ref 0.44–1.00)
GFR calc Af Amer: >60 mL/min (ref >60)
GFR calc non Af Amer: >60 mL/min (ref >60)
GLUCOSE: 94 mg/dL (ref 65–99)
POTASSIUM: 3.9 mmol/L (ref 3.5–5.1)
SODIUM: 137 mmol/L (ref 135–145)
Total Bilirubin: 0.7 mg/dL (ref 0.3–1.2)
Total Protein: 7 g/dL (ref 6.5–8.1)

## 2016-03-03 LAB — CBC WITH DIFFERENTIAL/PLATELET
BASOS ABS: 0.1 10*3/uL (ref 0–0.1)
BASOS PCT: 1 %
EOS ABS: 0.2 10*3/uL (ref 0–0.7)
Eosinophils Relative: 1 %
HCT: 40.1 % (ref 35.0–47.0)
HEMOGLOBIN: 13.4 g/dL (ref 12.0–16.0)
Lymphocytes Relative: 56 %
Lymphs Abs: 7.8 10*3/uL — ABNORMAL HIGH (ref 1.0–3.6)
MCH: 30.6 pg (ref 26.0–34.0)
MCHC: 33.5 g/dL (ref 32.0–36.0)
MCV: 91.2 fL (ref 80.0–100.0)
MONO ABS: 0.5 10*3/uL (ref 0.2–0.9)
MONOS PCT: 4 %
NEUTROS PCT: 38 %
Neutro Abs: 5.2 10*3/uL (ref 1.4–6.5)
Platelets: 276 10*3/uL (ref 150–440)
RBC: 4.4 MIL/uL (ref 3.80–5.20)
RDW: 13.4 % (ref 11.5–14.5)
WBC: 13.7 10*3/uL — ABNORMAL HIGH (ref 3.6–11.0)

## 2016-03-03 LAB — LACTATE DEHYDROGENASE: LDH: 152 U/L (ref 98–192)

## 2016-03-03 NOTE — Progress Notes (Signed)
[place] OFFICE PROGRESS NOTE  Patient Care Team: Madelyn Brunner, MD as PCP - General (Internal Medicine)  No matching staging information was found for the patient.   Oncology History   # 2013- CLL rai Stage 0 [Dr.Gittin; FISH- Positive for 13 q del- good prpgnostic]     CLL (chronic lymphocytic leukemia) ([place])     This is my first interaction with the patient as patient's primary oncologist has been Dr.Gittin. I reviewed the patient's prior charts/pertinent labs/imaging in detail; findings are summarized above.     INTERVAL HISTORY:  Suzanne Hogan 70 y.o.  female pleasant patient above history of CLL is here for follow-up. Patient denies any unusual shortness of breath or chest pain. Denies any cough. Denies any weight loss. Denies new lumps or bumps.  REVIEW OF SYSTEMS:  A complete 10 point review of system is done which is negative except mentioned above/history of present illness.   PAST MEDICAL HISTORY :  Past Medical History:  Diagnosis Date  . Cervical cancer ([place]) 1981  . Lymphocytosis     PAST SURGICAL HISTORY :   Past Surgical History:  Procedure Laterality Date  . ABDOMINAL HYSTERECTOMY  09/1979   [facility]    FAMILY HISTORY :   Family History  Problem Relation Age of Onset  . Cancer Mother     SOCIAL HISTORY:   Social History  Substance Use Topics  . Smoking status: Former Smoker    Years: 10.00    Quit date: 06/02/2002  . Smokeless tobacco: Never Used  . Alcohol use Yes     Comment: 1 GLASS  5 TIMES A WEEK    ALLERGIES:  is allergic to codeine and sulfa antibiotics.  MEDICATIONS:  Current Outpatient Prescriptions  Medication Sig Dispense Refill  . Calcium Carb-Cholecalciferol (CALCIUM + D3 PO) Take 1 Dose by mouth 2 (two) times daily. 900 mg AND VITAMIN D 1200. 1 IN AMD AN 1/2 IN PM    . cholecalciferol (VITAMIN D) 1000 UNITS tablet Take 1,000 Units by mouth daily.    . Multiple Vitamins-Minerals  (MULTIVITAMIN WITH MINERALS) tablet Take 1 tablet by mouth daily.    . Omega 3 1200 MG CAPS Take 1 capsule by mouth.    . vitamin C (ASCORBIC ACID) 500 MG tablet Take 500 mg by mouth daily.    . Zinc 25 MG TABS Take 1 tablet by mouth.     No current facility-administered medications for this visit.     PHYSICAL EXAMINATION: ECOG PERFORMANCE STATUS: 0 - Asymptomatic  BP 129/85 (BP Location: Left Arm, Patient Position: Sitting)   Pulse 73   Temp 97.3 F (36.3 C)   Resp 18   Wt 132 lb (59.9 kg)   BMI 22.81 kg/m   Filed Weights   03/03/16 1458  Weight: 132 lb (59.9 kg)    GENERAL: Well-nourished well-developed; Alert, no distress and comfortable.   Alone.  EYES: no pallor or icterus OROPHARYNX: no thrush or ulceration; good dentition  NECK: supple, no masses felt LYMPH:  no palpable lymphadenopathy in the cervical, axillary or inguinal regions LUNGS: clear to auscultation and  No wheeze or crackles HEART/CVS: regular rate & rhythm and no murmurs; No lower extremity edema ABDOMEN:abdomen soft, non-tender and normal bowel sounds Musculoskeletal:no cyanosis of digits and no clubbing  PSYCH: alert & oriented x 3 with fluent speech NEURO: no focal motor/sensory deficits SKIN:  no rashes or significant lesions  LABORATORY DATA:  I have reviewed the data as listed    Component Value Date/Time   NA 137 03/03/2016 1422   K 3.9 03/03/2016 1422   CL 103 03/03/2016 1422   CO2 26 03/03/2016 1422   GLUCOSE 94 03/03/2016 1422   BUN 23 (H) 03/03/2016 1422   CREATININE 0.87 03/03/2016 1422   CREATININE 0.81 02/18/2014 1500   CALCIUM 9.1 03/03/2016 1422   PROT 7.0 03/03/2016 1422   ALBUMIN 4.2 03/03/2016 1422   AST 25 03/03/2016 1422   ALT 19 03/03/2016 1422   ALKPHOS 62 03/03/2016 1422   BILITOT 0.7 03/03/2016 1422   GFRNONAA >60 03/03/2016 1422   GFRNONAA >60 02/18/2014 1500   GFRNONAA >60 10/08/2013 1453   GFRAA >60 03/03/2016 1422   GFRAA >60 02/18/2014 1500   GFRAA >60  10/08/2013 1453    No results found for: SPEP, UPEP  Lab Results  Component Value Date   WBC 13.7 (H) 03/03/2016   NEUTROABS 5.2 03/03/2016   HGB 13.4 03/03/2016   HCT 40.1 03/03/2016   MCV 91.2 03/03/2016   PLT 276 03/03/2016      Chemistry      Component Value Date/Time   NA 137 03/03/2016 1422   K 3.9 03/03/2016 1422   CL 103 03/03/2016 1422   CO2 26 03/03/2016 1422   BUN 23 (H) 03/03/2016 1422   CREATININE 0.87 03/03/2016 1422   CREATININE 0.81 02/18/2014 1500      Component Value Date/Time   CALCIUM 9.1 03/03/2016 1422   ALKPHOS 62 03/03/2016 1422   AST 25 03/03/2016 1422   ALT 19 03/03/2016 1422   BILITOT 0.7 03/03/2016 1422       RADIOGRAPHIC STUDIES: I have personally reviewed the radiological images as listed and agreed with the findings in the report. No results found.   ASSESSMENT & PLAN:  CLL (chronic lymphocytic leukemia) ([facility]) CLL- 13 q del/good prognostic Rai stage O- on surveillance.  Today white count 13.7 hemoglobin normal platelets normal. BMP normal. Clinically no evidence of progression. Discussed the natural history of CLL. Continue surveillance  # follow up in 6 months/labs.    No orders of the defined types were placed in this encounter.  All questions were answered. The patient knows to call the clinic with any problems, questions or concerns.      Cammie Sickle, MD 03/03/2016 3:29 PM

## 2016-03-03 NOTE — Progress Notes (Signed)
Pt here for follow up.  Denies concerns. Related mild concern about r gland swelling. Stated occurred in September. Better overall now she stated.

## 2016-03-03 NOTE — Assessment & Plan Note (Addendum)
CLL- 13 q del/good prognostic Rai stage O- on surveillance.  Today white count 13.7 hemoglobin normal platelets normal. BMP normal. Clinically no evidence of progression. Discussed the natural history of CLL. Continue surveillance  # follow up in 6 months/labs.

## 2016-03-11 ENCOUNTER — Other Ambulatory Visit: Payer: Self-pay | Admitting: Internal Medicine

## 2016-03-11 DIAGNOSIS — Z1231 Encounter for screening mammogram for malignant neoplasm of breast: Secondary | ICD-10-CM

## 2016-03-12 ENCOUNTER — Ambulatory Visit
Admission: RE | Admit: 2016-03-12 | Discharge: 2016-03-12 | Disposition: A | Discharge status: Home / Self Care | Payer: Medicare Other | Source: Ambulatory Visit | Attending: Internal Medicine | Admitting: Internal Medicine

## 2016-03-12 DIAGNOSIS — Z1231 Encounter for screening mammogram for malignant neoplasm of breast: Secondary | ICD-10-CM | POA: Diagnosis not present

## 2016-09-01 ENCOUNTER — Inpatient Hospital Stay: Payer: Medicare Other

## 2016-09-01 ENCOUNTER — Inpatient Hospital Stay: Payer: Medicare Other | Attending: Internal Medicine | Admitting: Internal Medicine

## 2016-09-01 VITALS — BP 127/85 | HR 67 | Temp 97.6°F | Resp 18 | Ht 63.78 in | Wt 133.4 lb

## 2016-09-01 DIAGNOSIS — Z8541 Personal history of malignant neoplasm of cervix uteri: Secondary | ICD-10-CM

## 2016-09-01 DIAGNOSIS — Z87891 Personal history of nicotine dependence: Secondary | ICD-10-CM

## 2016-09-01 DIAGNOSIS — C9110 Chronic lymphocytic leukemia of B-cell type not having achieved remission: Secondary | ICD-10-CM

## 2016-09-01 DIAGNOSIS — C9190 Lymphoid leukemia, unspecified not having achieved remission: Secondary | ICD-10-CM

## 2016-09-01 LAB — COMPREHENSIVE METABOLIC PANEL
ALK PHOS: 67 U/L (ref 38–126)
ALT: 15 U/L (ref 14–54)
ANION GAP: 7 (ref 5–15)
AST: 20 U/L (ref 15–41)
Albumin: 4.4 g/dL (ref 3.5–5.0)
BILIRUBIN TOTAL: 0.6 mg/dL (ref 0.3–1.2)
BUN: 22 mg/dL — ABNORMAL HIGH (ref 6–20)
CALCIUM: 9.3 mg/dL (ref 8.9–10.3)
CO2: 26 mmol/L (ref 22–32)
CREATININE: 0.78 mg/dL (ref 0.44–1.00)
Chloride: 104 mmol/L (ref 101–111)
Glucose, Bld: 107 mg/dL — ABNORMAL HIGH (ref 65–99)
Potassium: 3.8 mmol/L (ref 3.5–5.1)
SODIUM: 137 mmol/L (ref 135–145)
TOTAL PROTEIN: 7.2 g/dL (ref 6.5–8.1)

## 2016-09-01 LAB — CBC WITH DIFFERENTIAL/PLATELET
Basophils Absolute: 0.1 10*3/uL (ref 0–0.1)
Basophils Relative: 1 %
Eosinophils Absolute: 0.2 10*3/uL (ref 0–0.7)
Eosinophils Relative: 1 %
HCT: 42.2 % (ref 35.0–47.0)
HEMOGLOBIN: 14.4 g/dL (ref 12.0–16.0)
LYMPHS ABS: 8.4 10*3/uL — AB (ref 1.0–3.6)
LYMPHS PCT: 56 %
MCH: 30.9 pg (ref 26.0–34.0)
MCHC: 34 g/dL (ref 32.0–36.0)
MCV: 90.9 fL (ref 80.0–100.0)
MONOS PCT: 3 %
Monocytes Absolute: 0.5 10*3/uL (ref 0.2–0.9)
NEUTROS PCT: 39 %
Neutro Abs: 5.8 10*3/uL (ref 1.4–6.5)
Platelets: 292 10*3/uL (ref 150–440)
RBC: 4.64 MIL/uL (ref 3.80–5.20)
RDW: 13.4 % (ref 11.5–14.5)
WBC: 14.9 10*3/uL — ABNORMAL HIGH (ref 3.6–11.0)

## 2016-09-01 LAB — LACTATE DEHYDROGENASE: LDH: 137 U/L (ref 98–192)

## 2016-09-01 NOTE — Assessment & Plan Note (Signed)
CLL- 13 q del/good prognostic Rai stage O- on surveillance.  Today white count 19.9  hemoglobin normal platelets normal. BMP normal. Clinically no evidence of progression. Discussed the natural history of CLL. Continue surveillance  # left breast tenderness- ? Next mammogram December 2017-within normal limits. My breast exam is unremarkable. Recommend let us know if this continues to be a problem we'll repeat a mammogram. Patient agrees.  # follow up in 6 months/labs.

## 2016-09-01 NOTE — Progress Notes (Signed)
RN Chaperoned provider with Breast Exam.   

## 2016-09-01 NOTE — Progress Notes (Signed)
[facility] OFFICE PROGRESS NOTE  Patient Care Team: Tracie Harrier, MD (Internal Medicine)  Cancer Staging No matching staging information was found for the patient.   Oncology History   # 2013- CLL rai Stage 0 [Dr.Gittin; FISH- Positive for 13 q del- good prpgnostic]     CLL (chronic lymphocytic leukemia) ([place])       INTERVAL HISTORY:  Suzanne Hogan 71 y.o.  female pleasant patient above history of CLL is here for follow-up. Patient denies any unusual shortness of breath or chest pain. Denies any cough. Denies any weight loss. Denies new lumps or bumps.  Patient however notes to have a tenderness of the left breast intermittently over the last few months. Discomfort 2 on a scale of 10. No discharge. No nipple changes. No skin changes. 4-5 episodes that last for a day in a month. No bleeding or aggravating factors. Patient notes to have initially noticed the sensations posterior mammogram December 2017. The mammogram was within normal limits.  REVIEW OF SYSTEMS:  A complete 10 point review of system is done which is negative except mentioned above/history of present illness.   PAST MEDICAL HISTORY :  Past Medical History:  Diagnosis Date  . Cervical cancer ([place]) 1981  . Lymphocytosis     PAST SURGICAL HISTORY :   Past Surgical History:  Procedure Laterality Date  . ABDOMINAL HYSTERECTOMY  09/1979   [facility]    FAMILY HISTORY :   No family history on file.  SOCIAL HISTORY:   Social History  Substance Use Topics  . Smoking status: Former Smoker    Years: 10.00    Quit date: 06/02/2002  . Smokeless tobacco: Never Used  . Alcohol use Yes     Comment: 1 GLASS  5 TIMES A WEEK    ALLERGIES:  is allergic to codeine and sulfa antibiotics.  MEDICATIONS:  Current Outpatient Prescriptions  Medication Sig Dispense Refill  . b complex vitamins tablet Take 1 tablet by mouth daily.    . Calcium Carb-Cholecalciferol (CALCIUM + D3 PO) Take  1 Dose by mouth 2 (two) times daily. 900 mg AND VITAMIN D 1200. 1 IN AMD AN 1/2 IN PM    . cholecalciferol (VITAMIN D) 1000 UNITS tablet Take 1,000 Units by mouth daily.    . Multiple Vitamins-Minerals (MULTIVITAMIN WITH MINERALS) tablet Take 1 tablet by mouth daily.    . Omega 3 1200 MG CAPS Take 1 capsule by mouth.    . vitamin C (ASCORBIC ACID) 500 MG tablet Take 500 mg by mouth daily.    . Zinc 25 MG TABS Take 1 tablet by mouth.     No current facility-administered medications for this visit.     PHYSICAL EXAMINATION: ECOG PERFORMANCE STATUS: 0 - Asymptomatic  BP 127/85 (BP Location: Left Arm, Patient Position: Sitting)   Pulse 67   Temp 97.6 F (36.4 C) (Tympanic)   Resp 18   Ht 5' 3.78" (1.62 m)   Wt 133 lb 6.4 oz (60.5 kg)   BMI 23.06 kg/m   Filed Weights   09/01/16 1502  Weight: 133 lb 6.4 oz (60.5 kg)    GENERAL: Well-nourished well-developed; Alert, no distress and comfortable.   Alone.  EYES: no pallor or icterus OROPHARYNX: no thrush or ulceration; good dentition  NECK: supple, no masses felt LYMPH:  no palpable lymphadenopathy in the cervical, axillary or inguinal regions LUNGS: clear to auscultation and  No wheeze or crackles HEART/CVS: regular rate &  rhythm and no murmurs; No lower extremity edema ABDOMEN:abdomen soft, non-tender and normal bowel sounds Musculoskeletal:no cyanosis of digits and no clubbing  PSYCH: alert & oriented x 3 with fluent speech NEURO: no focal motor/sensory deficits SKIN:  no rashes or significant lesions Right and left BREAST exam [in the presence of nurse]- no unusual skin changes or dominant masses felt.   LABORATORY DATA:  I have reviewed the data as listed    Component Value Date/Time   NA 137 09/01/2016 1437   K 3.8 09/01/2016 1437   CL 104 09/01/2016 1437   CO2 26 09/01/2016 1437   GLUCOSE 107 (H) 09/01/2016 1437   BUN 22 (H) 09/01/2016 1437   CREATININE 0.78 09/01/2016 1437   CREATININE 0.81 02/18/2014 1500    CALCIUM 9.3 09/01/2016 1437   PROT 7.2 09/01/2016 1437   ALBUMIN 4.4 09/01/2016 1437   AST 20 09/01/2016 1437   ALT 15 09/01/2016 1437   ALKPHOS 67 09/01/2016 1437   BILITOT 0.6 09/01/2016 1437   GFRNONAA >60 09/01/2016 1437   GFRNONAA >60 02/18/2014 1500   GFRNONAA >60 10/08/2013 1453   GFRAA >60 09/01/2016 1437   GFRAA >60 02/18/2014 1500   GFRAA >60 10/08/2013 1453    No results found for: SPEP, UPEP  Lab Results  Component Value Date   WBC 14.9 (H) 09/01/2016   NEUTROABS 5.8 09/01/2016   HGB 14.4 09/01/2016   HCT 42.2 09/01/2016   MCV 90.9 09/01/2016   PLT 292 09/01/2016      Chemistry      Component Value Date/Time   NA 137 09/01/2016 1437   K 3.8 09/01/2016 1437   CL 104 09/01/2016 1437   CO2 26 09/01/2016 1437   BUN 22 (H) 09/01/2016 1437   CREATININE 0.78 09/01/2016 1437   CREATININE 0.81 02/18/2014 1500      Component Value Date/Time   CALCIUM 9.3 09/01/2016 1437   ALKPHOS 67 09/01/2016 1437   AST 20 09/01/2016 1437   ALT 15 09/01/2016 1437   BILITOT 0.6 09/01/2016 1437       RADIOGRAPHIC STUDIES: I have personally reviewed the radiological images as listed and agreed with the findings in the report. No results found.   ASSESSMENT & PLAN:  CLL (chronic lymphocytic leukemia) (HCC) CLL- 13 q del/good prognostic Rai stage O- on surveillance.  Today white count 19.9  hemoglobin normal platelets normal. BMP normal. Clinically no evidence of progression. Discussed the natural history of CLL. Continue surveillance  # left breast tenderness- ? Next mammogram December 2017-within normal limits. My breast exam is unremarkable. Recommend let us know if this continues to be a problem we'll repeat a mammogram. Patient agrees.  # follow up in 6 months/labs.    Orders Placed This Encounter  Procedures  . CBC with Differential    Standing Status:   Future    Standing Expiration Date:   09/01/2017  . Comprehensive metabolic panel    Standing Status:   Future     Standing Expiration Date:   09/01/2017  . Lactate dehydrogenase    Standing Status:   Future    Standing Expiration Date:   09/01/2017   All questions were answered. The patient knows to call the clinic with any problems, questions or concerns.      Cammie Sickle, MD 09/02/2016 9:46 PM

## 2016-09-01 NOTE — Progress Notes (Signed)
Patient here for f/u for CLL. Pt states that she has experience intermittent shooting pain in the left breast. She states that she started noticing the breast pain after her last mammogram in Dec. 2017. She performs her self breast exams and does not feel any evidence of any new lumps. No evidence of dimpling or redness of left breast.

## 2017-03-02 ENCOUNTER — Inpatient Hospital Stay (HOSPITAL_BASED_OUTPATIENT_CLINIC_OR_DEPARTMENT_OTHER): Payer: Medicare Other | Admitting: Internal Medicine

## 2017-03-02 ENCOUNTER — Inpatient Hospital Stay: Payer: Medicare Other | Attending: Internal Medicine

## 2017-03-02 ENCOUNTER — Encounter: Payer: Self-pay | Admitting: Internal Medicine

## 2017-03-02 VITALS — BP 114/75 | HR 67 | Temp 98.8°F | Resp 16 | Wt 127.0 lb

## 2017-03-02 DIAGNOSIS — Z87891 Personal history of nicotine dependence: Secondary | ICD-10-CM | POA: Insufficient documentation

## 2017-03-02 DIAGNOSIS — Z79899 Other long term (current) drug therapy: Secondary | ICD-10-CM | POA: Insufficient documentation

## 2017-03-02 DIAGNOSIS — Z8541 Personal history of malignant neoplasm of cervix uteri: Secondary | ICD-10-CM | POA: Insufficient documentation

## 2017-03-02 DIAGNOSIS — C9110 Chronic lymphocytic leukemia of B-cell type not having achieved remission: Secondary | ICD-10-CM | POA: Insufficient documentation

## 2017-03-02 LAB — COMPREHENSIVE METABOLIC PANEL
ALK PHOS: 61 U/L (ref 38–126)
ALT: 21 U/L (ref 14–54)
AST: 25 U/L (ref 15–41)
Albumin: 4.3 g/dL (ref 3.5–5.0)
Anion gap: 8 (ref 5–15)
BUN: 20 mg/dL (ref 6–20)
CALCIUM: 9.2 mg/dL (ref 8.9–10.3)
CO2: 27 mmol/L (ref 22–32)
CREATININE: 0.84 mg/dL (ref 0.44–1.00)
Chloride: 102 mmol/L (ref 101–111)
GFR calc non Af Amer: >60 mL/min (ref >60)
GLUCOSE: 96 mg/dL (ref 65–99)
Potassium: 3.9 mmol/L (ref 3.5–5.1)
SODIUM: 137 mmol/L (ref 135–145)
Total Bilirubin: 0.8 mg/dL (ref 0.3–1.2)
Total Protein: 6.9 g/dL (ref 6.5–8.1)

## 2017-03-02 LAB — LACTATE DEHYDROGENASE: LDH: 137 U/L (ref 98–192)

## 2017-03-02 LAB — CBC WITH DIFFERENTIAL/PLATELET
BASOS PCT: 1 %
Basophils Absolute: 0.1 10*3/uL (ref 0–0.1)
EOS ABS: 0.2 10*3/uL (ref 0–0.7)
EOS PCT: 1 %
HCT: 41.6 % (ref 35.0–47.0)
Hemoglobin: 13.7 g/dL (ref 12.0–16.0)
LYMPHS ABS: 8.1 10*3/uL — AB (ref 1.0–3.6)
Lymphocytes Relative: 61 %
MCH: 30.4 pg (ref 26.0–34.0)
MCHC: 33 g/dL (ref 32.0–36.0)
MCV: 92.1 fL (ref 80.0–100.0)
MONO ABS: 0.5 10*3/uL (ref 0.2–0.9)
MONOS PCT: 4 %
NEUTROS PCT: 33 %
Neutro Abs: 4.2 10*3/uL (ref 1.4–6.5)
PLATELETS: 272 10*3/uL (ref 150–440)
RBC: 4.51 MIL/uL (ref 3.80–5.20)
RDW: 13.2 % (ref 11.5–14.5)
WBC: 13 10*3/uL — ABNORMAL HIGH (ref 3.6–11.0)

## 2017-03-02 NOTE — Progress Notes (Signed)
[facility] OFFICE PROGRESS NOTE  Patient Care Team: Tracie Harrier, MD as PCP - General (Internal Medicine) Tracie Harrier, MD (Internal Medicine)  Cancer Staging No matching staging information was found for the patient.   Oncology History   # 2013- CLL rai Stage 0 [Dr.Gittin; FISH- Positive for 13 q del- good prpgnostic]     CLL (chronic lymphocytic leukemia) ([place])       INTERVAL HISTORY:  Suzanne Hogan 71 y.o.  female pleasant patient above history of CLL is here for follow-up.   Patient denies any unusual shortness of breath or chest pain. Denies any cough. Denies any weight loss. Denies new lumps or bumps.  REVIEW OF SYSTEMS:  A complete 10 point review of system is done which is negative except mentioned above/history of present illness.   PAST MEDICAL HISTORY :  Past Medical History:  Diagnosis Date  . Cervical cancer ([place]) 1981  . Lymphocytosis     PAST SURGICAL HISTORY :   Past Surgical History:  Procedure Laterality Date  . ABDOMINAL HYSTERECTOMY  09/1979   [facility]    FAMILY HISTORY :   History reviewed. No pertinent family history.  SOCIAL HISTORY:   Social History   Tobacco Use  . Smoking status: Former Smoker    Years: 10.00    Last attempt to quit: 06/02/2002    Years since quitting: 14.7  . Smokeless tobacco: Never Used  Substance Use Topics  . Alcohol use: Yes    Comment: 1 GLASS  5 TIMES A WEEK  . Drug use: No    ALLERGIES:  is allergic to codeine and sulfa antibiotics.  MEDICATIONS:  Current Outpatient Medications  Medication Sig Dispense Refill  . b complex vitamins tablet Take 1 tablet by mouth daily.    . Calcium Carb-Cholecalciferol (CALCIUM + D3 PO) Take 1 Dose by mouth 2 (two) times daily. 900 mg AND VITAMIN D 1200. 1 IN AMD AN 1/2 IN PM    . cholecalciferol (VITAMIN D) 1000 UNITS tablet Take 1,000 Units by mouth daily.    . Multiple Vitamins-Minerals (MULTIVITAMIN WITH MINERALS) tablet  Take 1 tablet by mouth daily.    . Omega 3 1200 MG CAPS Take 1 capsule by mouth.    . vitamin C (ASCORBIC ACID) 500 MG tablet Take 500 mg by mouth daily.    . Zinc 25 MG TABS Take 1 tablet by mouth.     No current facility-administered medications for this visit.     PHYSICAL EXAMINATION: ECOG PERFORMANCE STATUS: 0 - Asymptomatic  BP 114/75 (BP Location: Left Arm, Patient Position: Sitting)   Pulse 67   Temp 98.8 F (37.1 C) (Tympanic)   Resp 16   Wt 127 lb (57.6 kg)   BMI 21.95 kg/m   Filed Weights   03/02/17 1453 03/02/17 1459  Weight: 127 lb (57.6 kg) 127 lb (57.6 kg)    GENERAL: Well-nourished well-developed; Alert, no distress and comfortable.   Alone.  EYES: no pallor or icterus OROPHARYNX: no thrush or ulceration; good dentition  NECK: supple, no masses felt LYMPH:  no palpable lymphadenopathy in the cervical, axillary or inguinal regions LUNGS: clear to auscultation and  No wheeze or crackles HEART/CVS: regular rate & rhythm and no murmurs; No lower extremity edema ABDOMEN:abdomen soft, non-tender and normal bowel sounds Musculoskeletal:no cyanosis of digits and no clubbing  PSYCH: alert & oriented x 3 with fluent speech NEURO: no focal motor/sensory deficits SKIN:  no rashes or  significant lesions Right and left BREAST exam [in the presence of nurse]- no unusual skin changes or dominant masses felt.   LABORATORY DATA:  I have reviewed the data as listed    Component Value Date/Time   NA 137 03/02/2017 1435   K 3.9 03/02/2017 1435   CL 102 03/02/2017 1435   CO2 27 03/02/2017 1435   GLUCOSE 96 03/02/2017 1435   BUN 20 03/02/2017 1435   CREATININE 0.84 03/02/2017 1435   CREATININE 0.81 02/18/2014 1500   CALCIUM 9.2 03/02/2017 1435   PROT 6.9 03/02/2017 1435   ALBUMIN 4.3 03/02/2017 1435   AST 25 03/02/2017 1435   ALT 21 03/02/2017 1435   ALKPHOS 61 03/02/2017 1435   BILITOT 0.8 03/02/2017 1435   GFRNONAA >60 03/02/2017 1435   GFRNONAA >60 02/18/2014  1500   GFRNONAA >60 10/08/2013 1453   GFRAA >60 03/02/2017 1435   GFRAA >60 02/18/2014 1500   GFRAA >60 10/08/2013 1453    No results found for: SPEP, UPEP  Lab Results  Component Value Date   WBC 13.0 (H) 03/02/2017   NEUTROABS 4.2 03/02/2017   HGB 13.7 03/02/2017   HCT 41.6 03/02/2017   MCV 92.1 03/02/2017   PLT 272 03/02/2017      Chemistry      Component Value Date/Time   NA 137 03/02/2017 1435   K 3.9 03/02/2017 1435   CL 102 03/02/2017 1435   CO2 27 03/02/2017 1435   BUN 20 03/02/2017 1435   CREATININE 0.84 03/02/2017 1435   CREATININE 0.81 02/18/2014 1500      Component Value Date/Time   CALCIUM 9.2 03/02/2017 1435   ALKPHOS 61 03/02/2017 1435   AST 25 03/02/2017 1435   ALT 21 03/02/2017 1435   BILITOT 0.8 03/02/2017 1435       RADIOGRAPHIC STUDIES: I have personally reviewed the radiological images as listed and agreed with the findings in the report. No results found.   ASSESSMENT & PLAN:  CLL (chronic lymphocytic leukemia) ([facility]CC) CLL- 13 q del/good prognostic Rai stage O- on surveillance.  Today white count 13  hemoglobin normal platelets normal. BMP normal.  #  Clinically no evidence of progression. Discussed the natural history of CLL. Continue surveillance.   #Defer to PCP for further screening/routine care.  # follow up in 12 months/labs.    Orders Placed This Encounter  Procedures  . CBC with Differential/Platelet    Standing Status:   Future    Standing Expiration Date:   03/02/2018  . Lactate dehydrogenase    Standing Status:   Future    Standing Expiration Date:   03/02/2018  . Basic metabolic panel    Standing Status:   Future    Standing Expiration Date:   03/02/2018   All questions were answered. The patient knows to call the clinic with any problems, questions or concerns.      Cammie Sickle, MD 03/02/2017 7:04 PM

## 2017-03-02 NOTE — Assessment & Plan Note (Addendum)
CLL- 13 q del/good prognostic Rai stage O- on surveillance.  Today white count 13  hemoglobin normal platelets normal. BMP normal.  #  Clinically no evidence of progression. Discussed the natural history of CLL. Continue surveillance.   #Defer to PCP for further screening/routine care.  # follow up in 12 months/labs.

## 2017-03-17 ENCOUNTER — Other Ambulatory Visit: Payer: Self-pay | Admitting: Internal Medicine

## 2017-03-17 DIAGNOSIS — Z1239 Encounter for other screening for malignant neoplasm of breast: Secondary | ICD-10-CM

## 2017-04-14 ENCOUNTER — Ambulatory Visit
Admission: RE | Admit: 2017-04-14 | Discharge: 2017-04-14 | Disposition: A | Discharge status: Home / Self Care | Payer: Medicare Other | Source: Ambulatory Visit | Attending: Internal Medicine | Admitting: Internal Medicine

## 2017-04-14 DIAGNOSIS — Z1231 Encounter for screening mammogram for malignant neoplasm of breast: Secondary | ICD-10-CM | POA: Diagnosis not present

## 2017-04-14 DIAGNOSIS — Z1239 Encounter for other screening for malignant neoplasm of breast: Secondary | ICD-10-CM

## 2018-03-06 ENCOUNTER — Other Ambulatory Visit: Payer: Self-pay

## 2018-03-06 ENCOUNTER — Inpatient Hospital Stay (HOSPITAL_BASED_OUTPATIENT_CLINIC_OR_DEPARTMENT_OTHER): Payer: Medicare Other | Admitting: Internal Medicine

## 2018-03-06 ENCOUNTER — Other Ambulatory Visit: Payer: Self-pay | Admitting: *Deleted

## 2018-03-06 ENCOUNTER — Encounter: Payer: Self-pay | Admitting: Internal Medicine

## 2018-03-06 ENCOUNTER — Inpatient Hospital Stay: Payer: Medicare Other | Attending: Internal Medicine

## 2018-03-06 VITALS — BP 123/80 | HR 67 | Temp 97.3°F | Resp 18 | Ht 63.78 in | Wt 128.0 lb

## 2018-03-06 DIAGNOSIS — Z87891 Personal history of nicotine dependence: Secondary | ICD-10-CM

## 2018-03-06 DIAGNOSIS — Z79899 Other long term (current) drug therapy: Secondary | ICD-10-CM

## 2018-03-06 DIAGNOSIS — C9110 Chronic lymphocytic leukemia of B-cell type not having achieved remission: Secondary | ICD-10-CM | POA: Diagnosis present

## 2018-03-06 DIAGNOSIS — Z90710 Acquired absence of both cervix and uterus: Secondary | ICD-10-CM | POA: Insufficient documentation

## 2018-03-06 DIAGNOSIS — Z8541 Personal history of malignant neoplasm of cervix uteri: Secondary | ICD-10-CM | POA: Insufficient documentation

## 2018-03-06 LAB — COMPREHENSIVE METABOLIC PANEL
ALBUMIN: 4.2 g/dL (ref 3.5–5.0)
ALK PHOS: 62 U/L (ref 38–126)
ALT: 18 U/L (ref 0–44)
AST: 21 U/L (ref 15–41)
Anion gap: 6 (ref 5–15)
BILIRUBIN TOTAL: 0.4 mg/dL (ref 0.3–1.2)
BUN: 24 mg/dL — AB (ref 8–23)
CO2: 28 mmol/L (ref 22–32)
CREATININE: 0.93 mg/dL (ref 0.44–1.00)
Calcium: 9.5 mg/dL (ref 8.9–10.3)
Chloride: 101 mmol/L (ref 98–111)
GFR calc Af Amer: >60 mL/min (ref >60)
GLUCOSE: 95 mg/dL (ref 70–99)
Potassium: 4 mmol/L (ref 3.5–5.1)
Sodium: 135 mmol/L (ref 135–145)
TOTAL PROTEIN: 7.1 g/dL (ref 6.5–8.1)

## 2018-03-06 LAB — CBC WITH DIFFERENTIAL/PLATELET
Abs Immature Granulocytes: 0.02 10*3/uL (ref 0.00–0.07)
BASOS PCT: 1 %
Basophils Absolute: 0.1 10*3/uL (ref 0.0–0.1)
EOS PCT: 1 %
Eosinophils Absolute: 0.2 10*3/uL (ref 0.0–0.5)
HEMATOCRIT: 43 % (ref 36.0–46.0)
Hemoglobin: 13.9 g/dL (ref 12.0–15.0)
IMMATURE GRANULOCYTES: 0 %
Lymphocytes Relative: 56 %
Lymphs Abs: 8.1 10*3/uL — ABNORMAL HIGH (ref 0.7–4.0)
MCH: 30 pg (ref 26.0–34.0)
MCHC: 32.3 g/dL (ref 30.0–36.0)
MCV: 92.7 fL (ref 80.0–100.0)
MONO ABS: 1 10*3/uL (ref 0.1–1.0)
MONOS PCT: 7 %
NEUTROS ABS: 5 10*3/uL (ref 1.7–7.7)
Neutrophils Relative %: 35 %
PLATELETS: 313 10*3/uL (ref 150–400)
RBC: 4.64 MIL/uL (ref 3.87–5.11)
RDW: 12.8 % (ref 11.5–15.5)
WBC: 14.3 10*3/uL — AB (ref 4.0–10.5)
nRBC: 0.1 % (ref 0.0–0.2)

## 2018-03-06 LAB — LACTATE DEHYDROGENASE: LDH: 135 U/L (ref 98–192)

## 2018-03-06 NOTE — Progress Notes (Signed)
[place] OFFICE PROGRESS NOTE  Patient Care Team: Tracie Harrier, MD as PCP - General (Internal Medicine) Tracie Harrier, MD (Internal Medicine)  Cancer Staging No matching staging information was found for the patient.   Oncology History   # 2013- CLL rai Stage 0 [Dr.Gittin; FISH- Positive for 13 q del- good prpgnostic]   DIAGNOSIS: CLL  STAGE:  0       ;GOALS: control  CURRENT/MOST RECENT THERAPY: surveillaince      CLL (chronic lymphocytic leukemia) ([place])       INTERVAL HISTORY:  Suzanne Hogan 72 y.o.  female pleasant patient above history of CLL is here for follow-up.   Patient denies any weight loss.  Denies any lumps or bumps.  Appetite is good.  No nausea vomiting.  Review of Systems  Constitutional: Negative for chills, diaphoresis, fever, malaise/fatigue and weight loss.  HENT: Negative for nosebleeds and sore throat.   Eyes: Negative for double vision.  Respiratory: Negative for cough, hemoptysis, sputum production, shortness of breath and wheezing.   Cardiovascular: Negative for chest pain, palpitations, orthopnea and leg swelling.  Gastrointestinal: Negative for abdominal pain, blood in stool, constipation, diarrhea, heartburn, melena, nausea and vomiting.  Genitourinary: Negative for dysuria, frequency and urgency.  Musculoskeletal: Negative for back pain and joint pain.  Skin: Negative.  Negative for itching and rash.  Neurological: Negative for dizziness, tingling, focal weakness, weakness and headaches.  Endo/Heme/Allergies: Does not bruise/bleed easily.  Psychiatric/Behavioral: Negative for depression. The patient is not nervous/anxious and does not have insomnia.      PAST MEDICAL HISTORY :  Past Medical History:  Diagnosis Date  . Cervical cancer ([place]) 1981  . Lymphocytosis     PAST SURGICAL HISTORY :   Past Surgical History:  Procedure Laterality Date  . ABDOMINAL HYSTERECTOMY  09/1979   [facility]     FAMILY HISTORY :   Family History  Problem Relation Age of Onset  . Breast cancer Neg Hx     SOCIAL HISTORY:   Social History   Tobacco Use  . Smoking status: Former Smoker    Years: 10.00    Last attempt to quit: 06/02/2002    Years since quitting: 15.7  . Smokeless tobacco: Never Used  Substance Use Topics  . Alcohol use: Yes    Comment: 1 GLASS  5 TIMES A WEEK  . Drug use: No    ALLERGIES:  is allergic to codeine and sulfa antibiotics.  MEDICATIONS:  Current Outpatient Medications  Medication Sig Dispense Refill  . b complex vitamins tablet Take 1 tablet by mouth daily.    . Calcium Carb-Cholecalciferol (CALCIUM + D3 PO) Take 1 Dose by mouth 2 (two) times daily. 900 mg AND VITAMIN D 1200. 1 IN AMD AN 1/2 IN PM    . cholecalciferol (VITAMIN D) 1000 UNITS tablet Take 1,000 Units by mouth daily.    . Multiple Vitamins-Minerals (MULTIVITAMIN WITH MINERALS) tablet Take 1 tablet by mouth daily.    . Omega 3 1200 MG CAPS Take 1 capsule by mouth.    . vitamin C (ASCORBIC ACID) 500 MG tablet Take 500 mg by mouth daily.    . Zinc 25 MG TABS Take 1 tablet by mouth.     No current facility-administered medications for this visit.     PHYSICAL EXAMINATION: ECOG PERFORMANCE STATUS: 0 - Asymptomatic  BP 123/80 (Patient Position: Sitting)   Pulse 67   Temp (!) 97.3 F (36.3 C) (Oral)  Resp 18   Ht 5' 3.78" (1.62 m)   Wt 128 lb (58.1 kg)   BMI 22.12 kg/m   Filed Weights   03/06/18 1519  Weight: 128 lb (58.1 kg)    Physical Exam  Constitutional: She is oriented to person, place, and time and well-developed, well-nourished, and in no distress.  HENT:  Head: Normocephalic and atraumatic.  Mouth/Throat: Oropharynx is clear and moist. No oropharyngeal exudate.  Eyes: Pupils are equal, round, and reactive to light.  Neck: Normal range of motion. Neck supple.  Cardiovascular: Normal rate and regular rhythm.  Pulmonary/Chest: Breath sounds normal. No respiratory  distress. She has no wheezes.  Abdominal: Soft. Bowel sounds are normal. She exhibits no distension and no mass. There is no tenderness. There is no rebound and no guarding.  Musculoskeletal: Normal range of motion. She exhibits no edema or tenderness.  Neurological: She is alert and oriented to person, place, and time.  Skin: Skin is warm.  Psychiatric: Affect normal.     LABORATORY DATA:  I have reviewed the data as listed    Component Value Date/Time   NA 135 03/06/2018 1452   K 4.0 03/06/2018 1452   CL 101 03/06/2018 1452   CO2 28 03/06/2018 1452   GLUCOSE 95 03/06/2018 1452   BUN 24 (H) 03/06/2018 1452   CREATININE 0.93 03/06/2018 1452   CREATININE 0.81 02/18/2014 1500   CALCIUM 9.5 03/06/2018 1452   PROT 7.1 03/06/2018 1452   ALBUMIN 4.2 03/06/2018 1452   AST 21 03/06/2018 1452   ALT 18 03/06/2018 1452   ALKPHOS 62 03/06/2018 1452   BILITOT 0.4 03/06/2018 1452   GFRNONAA >60 03/06/2018 1452   GFRNONAA >60 02/18/2014 1500   GFRNONAA >60 10/08/2013 1453   GFRAA >60 03/06/2018 1452   GFRAA >60 02/18/2014 1500   GFRAA >60 10/08/2013 1453    No results found for: SPEP, UPEP  Lab Results  Component Value Date   WBC 14.3 (H) 03/06/2018   NEUTROABS 5.0 03/06/2018   HGB 13.9 03/06/2018   HCT 43.0 03/06/2018   MCV 92.7 03/06/2018   PLT 313 03/06/2018      Chemistry      Component Value Date/Time   NA 135 03/06/2018 1452   K 4.0 03/06/2018 1452   CL 101 03/06/2018 1452   CO2 28 03/06/2018 1452   BUN 24 (H) 03/06/2018 1452   CREATININE 0.93 03/06/2018 1452   CREATININE 0.81 02/18/2014 1500      Component Value Date/Time   CALCIUM 9.5 03/06/2018 1452   ALKPHOS 62 03/06/2018 1452   AST 21 03/06/2018 1452   ALT 18 03/06/2018 1452   BILITOT 0.4 03/06/2018 1452       RADIOGRAPHIC STUDIES: I have personally reviewed the radiological images as listed and agreed with the findings in the report. No results found.   ASSESSMENT & PLAN:  CLL (chronic  lymphocytic leukemia) ([place]) # CLL- 13 q del/good prognostic Rai stage O- on surveillance.  Today white count is 14.  Absolute lymphocyte count is 8.1.  Normal hemoglobin/platelets.  Stable.  #Clinically no evidence of progression.  Continue surveillance.  Again reviewed the natural history of CLL.  #Risk of secondary malignancy discussed.  Recommend appropriate screening./Skin exams.  # DISPOSITION: copy of labs-  # follow up in 12 months-MD/labs-cbc/cmp/ldh   No orders of the defined types were placed in this encounter.  All questions were answered. The patient knows to call the clinic with any problems, questions or concerns.  Cammie Sickle, MD 03/06/2018 4:15 PM

## 2018-03-06 NOTE — Assessment & Plan Note (Addendum)
#   CLL- 13 q del/good prognostic Rai stage O- on surveillance.  Today white count is 14.  Absolute lymphocyte count is 8.1.  Normal hemoglobin/platelets.  Stable.  #Clinically no evidence of progression.  Continue surveillance.  Again reviewed the natural history of CLL.  #Risk of secondary malignancy discussed.  Recommend appropriate screening./Skin exams.  # DISPOSITION: copy of labs-  # follow up in 12 months-MD/labs-cbc/cmp/ldh

## 2018-04-19 ENCOUNTER — Other Ambulatory Visit: Payer: Self-pay | Admitting: Internal Medicine

## 2018-04-19 DIAGNOSIS — Z1231 Encounter for screening mammogram for malignant neoplasm of breast: Secondary | ICD-10-CM

## 2018-05-10 ENCOUNTER — Ambulatory Visit
Admission: RE | Admit: 2018-05-10 | Discharge: 2018-05-10 | Disposition: A | Discharge status: Home / Self Care | Payer: Medicare Other | Source: Ambulatory Visit | Attending: Internal Medicine | Admitting: Internal Medicine

## 2018-05-10 DIAGNOSIS — Z1231 Encounter for screening mammogram for malignant neoplasm of breast: Secondary | ICD-10-CM | POA: Diagnosis present

## 2019-03-07 ENCOUNTER — Inpatient Hospital Stay: Payer: Medicare Other | Admitting: Internal Medicine

## 2019-03-07 ENCOUNTER — Inpatient Hospital Stay: Payer: Medicare Other

## 2019-04-25 ENCOUNTER — Other Ambulatory Visit: Payer: Self-pay | Admitting: Internal Medicine

## 2019-04-25 DIAGNOSIS — Z1231 Encounter for screening mammogram for malignant neoplasm of breast: Secondary | ICD-10-CM

## 2019-05-21 ENCOUNTER — Ambulatory Visit
Admission: RE | Admit: 2019-05-21 | Discharge: 2019-05-21 | Disposition: A | Discharge status: Home / Self Care | Payer: Medicare Other | Source: Ambulatory Visit | Attending: Internal Medicine | Admitting: Internal Medicine

## 2019-05-21 DIAGNOSIS — Z1231 Encounter for screening mammogram for malignant neoplasm of breast: Secondary | ICD-10-CM | POA: Diagnosis present

## 2019-07-09 ENCOUNTER — Other Ambulatory Visit: Payer: Self-pay | Admitting: Family Medicine

## 2019-07-09 DIAGNOSIS — R4781 Slurred speech: Secondary | ICD-10-CM

## 2019-07-09 DIAGNOSIS — R55 Syncope and collapse: Secondary | ICD-10-CM

## 2019-07-17 ENCOUNTER — Ambulatory Visit
Admission: RE | Admit: 2019-07-17 | Discharge: 2019-07-17 | Disposition: A | Discharge status: Home / Self Care | Payer: Medicare Other | Source: Ambulatory Visit | Attending: Family Medicine | Admitting: Family Medicine

## 2019-07-17 ENCOUNTER — Ambulatory Visit: Payer: Medicare Other

## 2019-07-17 ENCOUNTER — Other Ambulatory Visit: Payer: Self-pay

## 2019-07-17 DIAGNOSIS — R4781 Slurred speech: Secondary | ICD-10-CM

## 2019-07-17 DIAGNOSIS — R55 Syncope and collapse: Secondary | ICD-10-CM | POA: Diagnosis present

## 2019-12-31 ENCOUNTER — Other Ambulatory Visit: Payer: Medicare Other

## 2019-12-31 DIAGNOSIS — Z20822 Contact with and (suspected) exposure to covid-19: Secondary | ICD-10-CM

## 2020-01-01 LAB — SARS-COV-2, NAA 2 DAY TAT

## 2020-01-01 LAB — NOVEL CORONAVIRUS, NAA: SARS-CoV-2, NAA: NOT DETECTED

## 2020-08-08 ENCOUNTER — Other Ambulatory Visit: Payer: Self-pay | Admitting: Internal Medicine

## 2020-08-08 DIAGNOSIS — Z1231 Encounter for screening mammogram for malignant neoplasm of breast: Secondary | ICD-10-CM

## 2020-08-11 ENCOUNTER — Ambulatory Visit: Payer: Self-pay | Admitting: *Deleted

## 2020-08-11 NOTE — Telephone Encounter (Signed)
Patient and her husband called to confirm 2nd booster vaccination for tomorrow at 11:30 at [facility]. Patient reports she and her husband received a text message to confirm appt but have not received email at this time. Patient reports text message gave # to call and a link  for questions and this NT received the call. Unable to see confirmed appt in patient's chart that he is scheduled for tomorrow for vaccination only see scheduled Mammogram for tomorrow in chart at this time. Instructed patient to go to scheduled time tomorrow due to receiving text confirmation.. Patient verbalized understanding of going to get booster vaccination tomorrow at 11:30 at [facility] .

## 2020-08-12 ENCOUNTER — Ambulatory Visit
Admission: RE | Admit: 2020-08-12 | Discharge: 2020-08-12 | Disposition: A | Discharge status: Home / Self Care | Payer: Medicare Other | Source: Ambulatory Visit | Attending: Internal Medicine | Admitting: Internal Medicine

## 2020-08-12 ENCOUNTER — Other Ambulatory Visit: Payer: Self-pay

## 2020-08-12 DIAGNOSIS — Z87891 Personal history of nicotine dependence: Secondary | ICD-10-CM | POA: Diagnosis not present

## 2020-08-12 DIAGNOSIS — Z20822 Contact with and (suspected) exposure to covid-19: Secondary | ICD-10-CM | POA: Diagnosis not present

## 2020-08-12 DIAGNOSIS — Z856 Personal history of leukemia: Secondary | ICD-10-CM | POA: Diagnosis not present

## 2020-08-12 DIAGNOSIS — S01311A Laceration without foreign body of right ear, initial encounter: Secondary | ICD-10-CM | POA: Diagnosis not present

## 2020-08-12 DIAGNOSIS — Z885 Allergy status to narcotic agent status: Secondary | ICD-10-CM | POA: Diagnosis not present

## 2020-08-12 DIAGNOSIS — Z882 Allergy status to sulfonamides status: Secondary | ICD-10-CM | POA: Diagnosis not present

## 2020-08-12 DIAGNOSIS — Z79899 Other long term (current) drug therapy: Secondary | ICD-10-CM | POA: Diagnosis not present

## 2020-08-12 DIAGNOSIS — S42031A Displaced fracture of lateral end of right clavicle, initial encounter for closed fracture: Secondary | ICD-10-CM | POA: Diagnosis not present

## 2020-08-12 DIAGNOSIS — Z8541 Personal history of malignant neoplasm of cervix uteri: Secondary | ICD-10-CM | POA: Diagnosis not present

## 2020-08-12 DIAGNOSIS — R55 Syncope and collapse: Secondary | ICD-10-CM | POA: Diagnosis not present

## 2020-08-12 DIAGNOSIS — Z1231 Encounter for screening mammogram for malignant neoplasm of breast: Secondary | ICD-10-CM

## 2020-08-12 DIAGNOSIS — Z90710 Acquired absence of both cervix and uterus: Secondary | ICD-10-CM | POA: Diagnosis not present

## 2020-08-12 DIAGNOSIS — W07XXXA Fall from chair, initial encounter: Secondary | ICD-10-CM | POA: Diagnosis present

## 2020-08-13 ENCOUNTER — Other Ambulatory Visit: Payer: Self-pay

## 2020-08-13 ENCOUNTER — Emergency Department: Payer: Medicare Other

## 2020-08-13 ENCOUNTER — Encounter: Payer: Self-pay | Admitting: Internal Medicine

## 2020-08-13 ENCOUNTER — Inpatient Hospital Stay
Admission: EM | Admit: 2020-08-13 | Discharge: 2020-08-16 | DRG: 312 | Disposition: A | Discharge status: Home / Self Care | Payer: Medicare Other | Attending: Hospitalist | Admitting: Hospitalist

## 2020-08-13 DIAGNOSIS — R404 Transient alteration of awareness: Secondary | ICD-10-CM

## 2020-08-13 DIAGNOSIS — Z885 Allergy status to narcotic agent status: Secondary | ICD-10-CM

## 2020-08-13 DIAGNOSIS — Z90710 Acquired absence of both cervix and uterus: Secondary | ICD-10-CM

## 2020-08-13 DIAGNOSIS — Z882 Allergy status to sulfonamides status: Secondary | ICD-10-CM

## 2020-08-13 DIAGNOSIS — W07XXXA Fall from chair, initial encounter: Secondary | ICD-10-CM | POA: Diagnosis present

## 2020-08-13 DIAGNOSIS — Z20822 Contact with and (suspected) exposure to covid-19: Secondary | ICD-10-CM | POA: Diagnosis present

## 2020-08-13 DIAGNOSIS — Z1231 Encounter for screening mammogram for malignant neoplasm of breast: Secondary | ICD-10-CM

## 2020-08-13 DIAGNOSIS — R55 Syncope and collapse: Principal | ICD-10-CM | POA: Diagnosis present

## 2020-08-13 DIAGNOSIS — Z79899 Other long term (current) drug therapy: Secondary | ICD-10-CM

## 2020-08-13 DIAGNOSIS — Z856 Personal history of leukemia: Secondary | ICD-10-CM

## 2020-08-13 DIAGNOSIS — S42031A Displaced fracture of lateral end of right clavicle, initial encounter for closed fracture: Secondary | ICD-10-CM | POA: Diagnosis present

## 2020-08-13 DIAGNOSIS — Z8541 Personal history of malignant neoplasm of cervix uteri: Secondary | ICD-10-CM

## 2020-08-13 DIAGNOSIS — R569 Unspecified convulsions: Secondary | ICD-10-CM

## 2020-08-13 DIAGNOSIS — S01311A Laceration without foreign body of right ear, initial encounter: Secondary | ICD-10-CM | POA: Diagnosis present

## 2020-08-13 DIAGNOSIS — Z87891 Personal history of nicotine dependence: Secondary | ICD-10-CM

## 2020-08-13 HISTORY — DX: Unspecified convulsions: R56.9

## 2020-08-13 LAB — CBC WITH DIFFERENTIAL/PLATELET
Abs Immature Granulocytes: 0.03 10*3/uL (ref 0.00–0.07)
Basophils Absolute: 0.1 10*3/uL (ref 0.0–0.1)
Basophils Relative: 1 %
Eosinophils Absolute: 0.1 10*3/uL (ref 0.0–0.5)
Eosinophils Relative: 1 %
HCT: 40.6 % (ref 36.0–46.0)
Hemoglobin: 13.7 g/dL (ref 12.0–15.0)
Immature Granulocytes: 0 %
Lymphocytes Relative: 48 %
Lymphs Abs: 5.3 10*3/uL — ABNORMAL HIGH (ref 0.7–4.0)
MCH: 30.8 pg (ref 26.0–34.0)
MCHC: 33.7 g/dL (ref 30.0–36.0)
MCV: 91.2 fL (ref 80.0–100.0)
Monocytes Absolute: 0.6 10*3/uL (ref 0.1–1.0)
Monocytes Relative: 6 %
Neutro Abs: 4.9 10*3/uL (ref 1.7–7.7)
Neutrophils Relative %: 44 %
Platelets: 253 10*3/uL (ref 150–400)
RBC: 4.45 MIL/uL (ref 3.87–5.11)
RDW: 13.4 % (ref 11.5–15.5)
WBC: 10.9 10*3/uL — ABNORMAL HIGH (ref 4.0–10.5)
nRBC: 0.3 % — ABNORMAL HIGH (ref 0.0–0.2)

## 2020-08-13 LAB — TROPONIN I (HIGH SENSITIVITY)
Troponin I (High Sensitivity): 3 ng/L (ref <18)
Troponin I (High Sensitivity): 3 ng/L (ref <18)

## 2020-08-13 LAB — BASIC METABOLIC PANEL
Anion gap: 10 (ref 5–15)
BUN: 15 mg/dL (ref 8–23)
CO2: 24 mmol/L (ref 22–32)
Calcium: 8.8 mg/dL — ABNORMAL LOW (ref 8.9–10.3)
Chloride: 101 mmol/L (ref 98–111)
Creatinine, Ser: 0.85 mg/dL (ref 0.44–1.00)
GFR, Estimated: >60 mL/min (ref >60)
Glucose, Bld: 116 mg/dL — ABNORMAL HIGH (ref 70–99)
Potassium: 3.7 mmol/L (ref 3.5–5.1)
Sodium: 135 mmol/L (ref 135–145)

## 2020-08-13 MED ORDER — ACETAMINOPHEN 325 MG PO TABS
650.0000 mg | ORAL_TABLET | Freq: Four times a day (QID) | ORAL | Status: DC | PRN
  Administered 2020-08-14 (×2): 650 mg via ORAL
  Filled 2020-08-13 (×2): qty 2

## 2020-08-13 MED ORDER — ONDANSETRON HCL 4 MG/2ML IJ SOLN: 4.0000 mg | Freq: Four times a day (QID) | INTRAMUSCULAR | Status: DC | PRN

## 2020-08-13 MED ORDER — ACETAMINOPHEN 650 MG RE SUPP: 650.0000 mg | Freq: Four times a day (QID) | RECTAL | Status: DC | PRN

## 2020-08-13 MED ORDER — SODIUM CHLORIDE 0.9 % IV SOLN: INTRAVENOUS | Status: DC

## 2020-08-13 MED ORDER — DOCUSATE SODIUM 100 MG PO CAPS
100.0000 mg | ORAL_CAPSULE | Freq: Two times a day (BID) | ORAL | Status: DC
  Filled 2020-08-13 (×4): qty 1

## 2020-08-13 MED ORDER — RENA-VITE PO TABS
1.0000 | ORAL_TABLET | Freq: Every day | ORAL | Status: DC
  Administered 2020-08-14 – 2020-08-16 (×2): 1 via ORAL
  Filled 2020-08-13 (×3): qty 1

## 2020-08-13 MED ORDER — VITAMIN D 25 MCG (1000 UNIT) PO TABS
1000.0000 [IU] | ORAL_TABLET | Freq: Every day | ORAL | Status: DC
  Administered 2020-08-14 – 2020-08-16 (×3): 1000 [IU] via ORAL
  Filled 2020-08-13 (×3): qty 1

## 2020-08-13 MED ORDER — ONDANSETRON HCL 4 MG PO TABS: 4.0000 mg | ORAL_TABLET | Freq: Four times a day (QID) | ORAL | Status: DC | PRN

## 2020-08-13 MED ORDER — ENOXAPARIN SODIUM 40 MG/0.4ML IJ SOSY
40.0000 mg | PREFILLED_SYRINGE | INTRAMUSCULAR | Status: DC
  Administered 2020-08-14 – 2020-08-15 (×3): 40 mg via SUBCUTANEOUS
  Filled 2020-08-13 (×3): qty 0.4

## 2020-08-13 NOTE — H&P (Addendum)
[facility] History and Physical  Suzanne Hogan OXB:[id number] DOB: [birth year] DOA: 08/13/2020  Referring physician: ED  PCP: Suzanne Harrier, MD   Patient is coming from: Home  Chief Complaint: Fall  HPI: Suzanne Hogan is a 75 y.o. female with past medical history of CLL, cervical cancer status post ectomy presented to hospital after sustaining a syncopal episode today while at dinner at a restaurant.  She fell off from a chair onto the concrete and lost consciousness.  Prior to the fall she had mild dizziness.  Patient stated that she hit her right shoulder and complained of the right shoulder pain.  Patient did have a similar syncopal episode on Easter Sunday when her primary care physician recommended Holter monitor.  This was removed 3 days back and has not had a report yet.  Patient denies any nausea, vomiting, diarrhea.  Denies sudden changes in her posture.  Denies any changes in her medication.  Denies any fever, chills or rigor.  Denies any cough shortness of breath chest pain or palpitation.  No history of urinary urgency, frequency or dysuria.  ED Course:  In the ED, patient was noted to have some bruising and laceration to the right ear.  EKG was normal sinus rhythm.  WBC was 10.9.  Electrolytes within normal range.  X-ray of the right shoulder performed in the ED showed fracture of the clavicle and the patient was put on sling.  CT head scan including CT cervical spine was negative for acute findings.  Patient was then considered for observation in the hospital.  Review of Systems:  All systems were reviewed and were negative unless otherwise mentioned in the HPI  Past Medical History:  Diagnosis Date  . Cervical cancer ([place]) 1981  . Lymphocytosis    Past Surgical History:  Procedure Laterality Date  . ABDOMINAL HYSTERECTOMY  09/1979   [facility]    Social History:  reports that she quit smoking about 18 years ago. She quit after 10.00 years of  use. She has never used smokeless tobacco. She reports current alcohol use. She reports that she does not use drugs.  Allergies  Allergen Reactions  . Codeine Diarrhea and Nausea And Vomiting  . Sulfa Antibiotics Rash    Family History  Problem Relation Age of Onset  . Breast cancer Neg Hx      Prior to Admission medications   Medication Sig Start Date End Date Taking? Authorizing Provider  b complex vitamins tablet Take 1 tablet by mouth daily.    [provider]  Calcium Carb-Cholecalciferol (CALCIUM + D3 PO) Take 1 Dose by mouth 2 (two) times daily. 900 mg AND VITAMIN D 1200. 1 IN AMD AN 1/2 IN PM    [provider]  cholecalciferol (VITAMIN D) 1000 UNITS tablet Take 1,000 Units by mouth daily.    [provider]  Multiple Vitamins-Minerals (MULTIVITAMIN WITH MINERALS) tablet Take 1 tablet by mouth daily.    [provider]  Omega 3 1200 MG CAPS Take 1 capsule by mouth.    [provider]  vitamin C (ASCORBIC ACID) 500 MG tablet Take 500 mg by mouth daily.    [provider]  Zinc 25 MG TABS Take 1 tablet by mouth.    [provider]    Physical Exam: Vitals:   08/13/20 2012 08/13/20 2017 08/13/20 2100 08/13/20 2200  BP: 120/77  (!) 134/94 (!) 143/81  Pulse: 76  79 79  Resp: 19  17 17  Temp: 97.9 F (36.6 C)     TempSrc: Oral     SpO2: 98%  99% 97%  Weight:  59 kg    Height:  5\' 2"  (1.575 m)     Wt Readings from Last 3 Encounters:  08/13/20 59 kg  03/06/18 58.1 kg  03/02/17 57.6 kg   Body mass index is 23.78 kg/m.  General:  Average built, not in obvious distress HENT: Normocephalic, pupils equally reacting to light and accommodation.  Small hematoma over the right ear with superficial laceration.  No scleral pallor or icterus noted. Oral mucosa is moist.  Chest:  Clear breath sounds.  Diminished breath sounds bilaterally. No crackles or wheezes.  CVS: S1 &S2 heard. No murmur.  Regular rate and  rhythm. Abdomen: Soft, nontender, nondistended.  Bowel sounds are heard.  Liver is not palpable, no abdominal mass palpated Extremities: No cyanosis, clubbing or edema.  Peripheral pulses are palpable. Psych: Alert, awake and oriented, normal mood CNS:  No cranial nerve deficits.  Power equal in all extremities.   No cerebellar signs.   Skin: Warm and dry.  No rashes noted.  Right ear superficial laceration with ecchymosis.  Labs on Admission:   CBC: Recent Labs  Lab 08/13/20 2050  WBC 10.9*  NEUTROABS 4.9  HGB 13.7  HCT 40.6  MCV 91.2  PLT 123456    Basic Metabolic Panel: Recent Labs  Lab 08/13/20 2050  NA 135  K 3.7  CL 101  CO2 24  GLUCOSE 116*  BUN 15  CREATININE 0.85  CALCIUM 8.8*    Liver Function Tests: No results for input(s): AST, ALT, ALKPHOS, BILITOT, PROT, ALBUMIN in the last 168 hours. No results for input(s): LIPASE, AMYLASE in the last 168 hours. No results for input(s): AMMONIA in the last 168 hours.  Cardiac Enzymes: No results for input(s): CKTOTAL, CKMB, CKMBINDEX, TROPONINI in the last 168 hours.  BNP (last 3 results) No results for input(s): BNP in the last 8760 hours.  ProBNP (last 3 results) No results for input(s): PROBNP in the last 8760 hours.  CBG: No results for input(s): GLUCAP in the last 168 hours.  Lipase  No results found for: LIPASE   Urinalysis No results found for: COLORURINE, APPEARANCEUR, LABSPEC, PHURINE, GLUCOSEU, HGBUR, BILIRUBINUR, KETONESUR, PROTEINUR, UROBILINOGEN, NITRITE, LEUKOCYTESUR   Drugs of Abuse  No results found for: LABOPIA, COCAINSCRNUR, [place], AMPHETMU, THCU, LABBARB    Radiological Exams on Admission: DG Shoulder Right  Result Date: 08/13/2020 CLINICAL DATA:  Fall, syncopal episode.  Right shoulder pain. EXAM: RIGHT SHOULDER - 2+ VIEW COMPARISON:  None. FINDINGS: Displaced distal clavicle fracture with 1 shaft width superior displacement of distal fracture fragment. There is mild offset of the  acromioclavicular joint. No coracoclavicular joint space widening. No additional fracture. Glenohumeral joint is congruent. No fracture of the included right ribs. IMPRESSION: Displaced distal clavicle fracture with 1 shaft width superior displacement of distal fracture fragment. Mild offset of the acromioclavicular joint. Electronically Signed   By: Keith Rake M.D.   On: 08/13/2020 20:51   CT Head Wo Contrast  Result Date: 08/13/2020 CLINICAL DATA:  Syncopal episode bruising and laceration to right ear EXAM: CT HEAD WITHOUT CONTRAST CT CERVICAL SPINE WITHOUT CONTRAST TECHNIQUE: Multidetector CT imaging of the head and cervical spine was performed following the standard protocol without intravenous contrast. Multiplanar CT image reconstructions of the cervical spine were also generated. COMPARISON:  None. FINDINGS: CT HEAD FINDINGS Brain: No acute territorial infarction, hemorrhage, or  intracranial mass. Mild atrophy. Nonenlarged ventricles. Vascular: No hyperdense vessels.  Carotid vascular calcification Skull: Normal. Negative for fracture or focal lesion. Sinuses/Orbits: No acute finding. Other: None CT CERVICAL SPINE FINDINGS Alignment: Reversal of cervical lordosis. 4 mm anterolisthesis C4 on C5 and 6 mm anterolisthesis C7 on T1. Facet alignment is maintained Skull base and vertebrae: No acute fracture. No primary bone lesion or focal pathologic process. Soft tissues and spinal canal: No prevertebral fluid or swelling. No visible canal hematoma. Disc levels: Moderate severe diffuse degenerative change C3 through T1 with advanced degenerative changes also at T1-T2. Hypertrophic facet degenerative changes at multiple levels. Upper chest: Right apical scarring Other: None IMPRESSION: 1. No CT evidence for acute intracranial abnormality. 2. Reversal of cervical lordosis with degenerative changes. No fracture seen. Anterolisthesis C4 on C5 and C7 on T1 probably due to degenerative changes Electronically  Signed   By: Donavan Foil M.D.   On: 08/13/2020 21:02   CT Cervical Spine Wo Contrast  Result Date: 08/13/2020 CLINICAL DATA:  Syncopal episode bruising and laceration to right ear EXAM: CT HEAD WITHOUT CONTRAST CT CERVICAL SPINE WITHOUT CONTRAST TECHNIQUE: Multidetector CT imaging of the head and cervical spine was performed following the standard protocol without intravenous contrast. Multiplanar CT image reconstructions of the cervical spine were also generated. COMPARISON:  None. FINDINGS: CT HEAD FINDINGS Brain: No acute territorial infarction, hemorrhage, or intracranial mass. Mild atrophy. Nonenlarged ventricles. Vascular: No hyperdense vessels.  Carotid vascular calcification Skull: Normal. Negative for fracture or focal lesion. Sinuses/Orbits: No acute finding. Other: None CT CERVICAL SPINE FINDINGS Alignment: Reversal of cervical lordosis. 4 mm anterolisthesis C4 on C5 and 6 mm anterolisthesis C7 on T1. Facet alignment is maintained Skull base and vertebrae: No acute fracture. No primary bone lesion or focal pathologic process. Soft tissues and spinal canal: No prevertebral fluid or swelling. No visible canal hematoma. Disc levels: Moderate severe diffuse degenerative change C3 through T1 with advanced degenerative changes also at T1-T2. Hypertrophic facet degenerative changes at multiple levels. Upper chest: Right apical scarring Other: None IMPRESSION: 1. No CT evidence for acute intracranial abnormality. 2. Reversal of cervical lordosis with degenerative changes. No fracture seen. Anterolisthesis C4 on C5 and C7 on T1 probably due to degenerative changes Electronically Signed   By: Donavan Foil M.D.   On: 08/13/2020 21:02   MM 3D SCREEN BREAST BILATERAL  Result Date: 08/13/2020 CLINICAL DATA:  Screening. EXAM: DIGITAL SCREENING BILATERAL MAMMOGRAM WITH TOMOSYNTHESIS AND CAD TECHNIQUE: Bilateral screening digital craniocaudal and mediolateral oblique mammograms were obtained. Bilateral  screening digital breast tomosynthesis was performed. The images were evaluated with computer-aided detection. COMPARISON:  Previous exam(s). ACR Breast Density Category b: There are scattered areas of fibroglandular density. FINDINGS: There are no findings suspicious for malignancy. The images were evaluated with computer-aided detection. IMPRESSION: No mammographic evidence of malignancy. A result letter of this screening mammogram will be mailed directly to the patient. RECOMMENDATION: Screening mammogram in one year. (Code:SM-B-01Y) BI-RADS CATEGORY  1: Negative. Electronically Signed   By: Lajean Manes M.D.   On: 08/13/2020 09:19    EKG: Personally reviewed by me which shows normal sinus rhythm  Assessment/Plan Active Problems:   Syncope  Syncope. Preceded by mild lightheadedness.  Unclear whether this is cardiogenic or vasovagal.  Was on Holter monitor as outpatient by PCP.  We will continue with telemetry in hospital.  We will get orthostatic blood pressure every shift x3.Marland Kitchen  Continue with gentle IV fluid hydration today.  Trend troponins.  Check  2D echocardiogram to assess for LV function and valvular abnormalities.  We will get physical therapy evaluation.  Fall with right displaced distal clavicular fracture.  On sling at this time.  Continue pain management and supportive care.  History of CLL.  Stable at this time.  History of cervical cancer.  Status post hysterectomy.   DVT Prophylaxis: Lovenox subcu  Consultant: None  Code Status: Full code  Microbiology none  Antibiotics: None  Family Communication:  Patients' condition and plan of care including tests being ordered have been discussed with the patient and the patient's spouse at bedside who indicate understanding and agree with the plan.   Status is: Observation  The patient remains OBS appropriate and will d/c before 2 midnights.  Dispo: The patient is from: Home              Anticipated d/c is to: Home                Severity of Illness: The appropriate patient status for this patient is OBSERVATION. Observation status is judged to be reasonable and necessary in order to provide the required intensity of service to ensure the patient's safety. The patient's presenting symptoms, physical exam findings, and initial radiographic and laboratory data in the context of their medical condition is felt to place them at decreased risk for further clinical deterioration. Furthermore, it is anticipated that the patient will be medically stable for discharge from the hospital within 2 midnights of admission. The following factors support the patient status of observation.  Signed, Flora Lipps, MD [facility] 08/13/2020

## 2020-08-13 NOTE — ED Provider Notes (Signed)
[facility] Emergency Department Provider Note   ____________________________________________   Event Date/Time   First MD Initiated Contact with Patient 08/13/20 2010     (approximate)  I have reviewed the triage vital signs and the nursing notes.   HISTORY  Chief Complaint Loss of Consciousness, Shoulder Pain, and Ear Laceration    HPI Suzanne Hogan is a 75 y.o. female with past medical history of CLL who presents to the ED complaining of syncope.  Patient reports that she was out to dinner at a restaurant when she suddenly started feeling lightheaded.  She ended up losing consciousness and falling out of her chair into the right side, striking her head.  She also believes she struck her right shoulder and she currently complains of headache as well as right shoulder pain.  She denies any chest pain or shortness of breath with this episode, does feel like her speech is slightly slurred after waking up.  She denies any vision changes, numbness, or weakness.  She did have a similar syncopal episode on Easter Sunday, saw her PCP for this problem and had Holter monitor placed.  This was removed 3 days ago but she has not yet gotten results back.  She denies any significant cardiac history.        Past Medical History:  Diagnosis Date  . Cervical cancer ([place]) 1981  . Lymphocytosis     Patient Active Problem List   Diagnosis Date Noted  . CLL (chronic lymphocytic leukemia) ([place]) 10/09/2014    Past Surgical History:  Procedure Laterality Date  . ABDOMINAL HYSTERECTOMY  09/1979   AT [facility] OVARIES [facility]    Prior to Admission medications   Medication Sig Start Date End Date Taking? Authorizing Provider  b complex vitamins tablet Take 1 tablet by mouth daily.    [provider]  Calcium Carb-Cholecalciferol (CALCIUM + D3 PO) Take 1 Dose by mouth 2 (two) times daily. 900 mg AND VITAMIN D 1200. 1 IN AMD AN 1/2 IN PM    [provider]   cholecalciferol (VITAMIN D) 1000 UNITS tablet Take 1,000 Units by mouth daily.    [provider]  Multiple Vitamins-Minerals (MULTIVITAMIN WITH MINERALS) tablet Take 1 tablet by mouth daily.    [provider]  Omega 3 1200 MG CAPS Take 1 capsule by mouth.    [provider]  vitamin C (ASCORBIC ACID) 500 MG tablet Take 500 mg by mouth daily.    [provider]  Zinc 25 MG TABS Take 1 tablet by mouth.    [provider]    Allergies Codeine and Sulfa antibiotics  Family History  Problem Relation Age of Onset  . Breast cancer Neg Hx     Social History Social History   Tobacco Use  . Smoking status: Former Smoker    Years: 10.00    Quit date: 06/02/2002    Years since quitting: 18.2  . Smokeless tobacco: Never Used  Substance Use Topics  . Alcohol use: Yes    Comment: 1 GLASS  5 TIMES A WEEK  . Drug use: No    Review of Systems  Constitutional: No fever/chills Eyes: No visual changes. ENT: No sore throat. Cardiovascular: Denies chest pain.  Positive for syncope. Respiratory: Denies shortness of breath. Gastrointestinal: No abdominal pain.  No nausea, no vomiting.  No diarrhea.  No constipation. Genitourinary: Negative for dysuria. Musculoskeletal: Negative for back pain. Skin: Negative for rash. Neurological: Negative for headaches, focal  weakness or numbness.  ____________________________________________   PHYSICAL EXAM:  VITAL SIGNS: ED Triage Vitals  Enc Vitals Group     BP      Pulse      Resp      Temp      Temp src      SpO2      Weight      Height      Head Circumference      Peak Flow      Pain Score      Pain Loc      Pain Edu?      Excl. in [place]?     Constitutional: Alert and oriented. Eyes: Conjunctivae are normal. Head: Small hematoma to right ear with superficial laceration, no active bleeding noted. No scalp hematomas or step offs. Nose: No congestion/rhinnorhea. Mouth/Throat: Mucous  membranes are moist. Neck: Normal ROM Cardiovascular: Normal rate, regular rhythm. Grossly normal heart sounds. Respiratory: Normal respiratory effort.  No retractions. Lungs CTAB. Gastrointestinal: Soft and nontender. No distention. Genitourinary: deferred Musculoskeletal: No lower extremity tenderness nor edema. Neurologic:  Normal speech and language. No gross focal neurologic deficits are appreciated. Skin:  Skin is warm, dry and intact. No rash noted. Psychiatric: Mood and affect are normal. Speech and behavior are normal.  ____________________________________________   LABS (all labs ordered are listed, but only abnormal results are displayed)  Labs Reviewed  CBC WITH DIFFERENTIAL/PLATELET - Abnormal; Notable for the following components:      Result Value   WBC 10.9 (*)    nRBC 0.3 (*)    Lymphs Abs 5.3 (*)    All other components within normal limits  BASIC METABOLIC PANEL - Abnormal; Notable for the following components:   Glucose, Bld 116 (*)    Calcium 8.8 (*)    All other components within normal limits  SARS CORONAVIRUS 2 (TAT 6-24 HRS)  TROPONIN I (HIGH SENSITIVITY)  TROPONIN I (HIGH SENSITIVITY)   ____________________________________________  EKG  ED ECG REPORT I, Blake Divine, the attending physician, personally viewed and interpreted this ECG.   Date: 08/13/2020  EKG Time: 20:58  Rate: 84  Rhythm: normal EKG, normal sinus rhythm, unchanged from previous tracings  Axis: Normal  Intervals:none  ST&T Change: None   PROCEDURES  Procedure(s) performed (including Critical Care):  Procedures   ____________________________________________   INITIAL IMPRESSION / ASSESSMENT AND PLAN / ED COURSE       75 year old female with past medical history of CLL who presents to the ED following syncopal episode at a restaurant where she fell out of her chair and struck the right side of her head.  She does have signs of trauma to the right side of her head  and we will check CT head and cervical spine.  Patient reports some subjective slurred speech but her speech seems to be intact on my assessment and she has no focal neurologic deficits on exam.  I have a low suspicion for stroke but I am concerned for cardiac etiology of her syncope.  EKG shows no signs of arrhythmia or ischemia, we will check labs including troponin.  She additionally complains of pain to her right shoulder, which we will further assess with x-ray.  Shoulder x-ray reviewed by me and shows displaced fracture of distal clavicle, we will place patient in sling for comfort.  CT head and cervical spine are negative for acute process.  Labs are unremarkable, troponin within normal limits.  Given patient's multiple syncopal episodes with unclear cause,  case discussed with hospitalist for admission.      ____________________________________________   FINAL CLINICAL IMPRESSION(S) / ED DIAGNOSES  Final diagnoses:  Syncope and collapse  Closed displaced fracture of acromial end of right clavicle, initial encounter     ED Discharge Orders    None       Note:  This document was prepared using Dragon voice recognition software and may include unintentional dictation errors.   Blake Divine, MD 08/13/20 2205

## 2020-08-13 NOTE — Progress Notes (Signed)
Anticoagulation monitoring(Lovenox):  75 yo female ordered Lovenox 30 mg Q24h  Filed Weights   08/13/20 2017  Weight: 59 kg (130 lb)   BMI 23.78   Lab Results  Component Value Date   CREATININE 0.85 08/13/2020   CREATININE 0.93 03/06/2018   CREATININE 0.84 03/02/2017   Estimated Creatinine Clearance: 43.8 mL/min (by C-G formula based on SCr of 0.85 mg/dL). Hemoglobin & Hematocrit     Component Value Date/Time   HGB 13.7 08/13/2020 2050   HGB 14.1 02/18/2014 1500   HCT 40.6 08/13/2020 2050   HCT 43.4 02/18/2014 1500     Per Protocol for Patient with estCrcl > 30 ml/min and BMI < 40, will transition to Lovenox 40 mg Q24h.

## 2020-08-13 NOTE — ED Triage Notes (Signed)
Pt has been having several syncopal episodes since easter. Pt has seen doctors and worn a halter monitor. Pt today had a syncopal episode and fell off of a chair fell onto concrete. Bruising and a small laceration to right ear. Right should is painful to touch.

## 2020-08-14 ENCOUNTER — Inpatient Hospital Stay: Payer: Medicare Other

## 2020-08-14 ENCOUNTER — Observation Stay
Admit: 2020-08-14 | Discharge: 2020-08-14 | Disposition: A | Discharge status: Home / Self Care | Payer: Medicare Other | Attending: Internal Medicine | Admitting: Internal Medicine

## 2020-08-14 DIAGNOSIS — S42031A Displaced fracture of lateral end of right clavicle, initial encounter for closed fracture: Secondary | ICD-10-CM | POA: Diagnosis present

## 2020-08-14 DIAGNOSIS — Z87891 Personal history of nicotine dependence: Secondary | ICD-10-CM | POA: Diagnosis not present

## 2020-08-14 DIAGNOSIS — S01311A Laceration without foreign body of right ear, initial encounter: Secondary | ICD-10-CM | POA: Diagnosis present

## 2020-08-14 DIAGNOSIS — Z882 Allergy status to sulfonamides status: Secondary | ICD-10-CM | POA: Diagnosis not present

## 2020-08-14 DIAGNOSIS — Z885 Allergy status to narcotic agent status: Secondary | ICD-10-CM | POA: Diagnosis not present

## 2020-08-14 DIAGNOSIS — Z856 Personal history of leukemia: Secondary | ICD-10-CM | POA: Diagnosis not present

## 2020-08-14 DIAGNOSIS — W07XXXA Fall from chair, initial encounter: Secondary | ICD-10-CM | POA: Diagnosis present

## 2020-08-14 DIAGNOSIS — Z90710 Acquired absence of both cervix and uterus: Secondary | ICD-10-CM | POA: Diagnosis not present

## 2020-08-14 DIAGNOSIS — Z79899 Other long term (current) drug therapy: Secondary | ICD-10-CM | POA: Diagnosis not present

## 2020-08-14 DIAGNOSIS — Z8541 Personal history of malignant neoplasm of cervix uteri: Secondary | ICD-10-CM | POA: Diagnosis not present

## 2020-08-14 DIAGNOSIS — Z20822 Contact with and (suspected) exposure to covid-19: Secondary | ICD-10-CM | POA: Diagnosis present

## 2020-08-14 DIAGNOSIS — R55 Syncope and collapse: Secondary | ICD-10-CM | POA: Diagnosis present

## 2020-08-14 DIAGNOSIS — R404 Transient alteration of awareness: Secondary | ICD-10-CM | POA: Diagnosis not present

## 2020-08-14 DIAGNOSIS — W19XXXA Unspecified fall, initial encounter: Secondary | ICD-10-CM | POA: Diagnosis not present

## 2020-08-14 LAB — ECHOCARDIOGRAM COMPLETE
AR max vel: 2.66 cm2
AV Area VTI: 2.18 cm2
AV Area mean vel: 2.33 cm2
AV Mean grad: 4 mmHg
AV Peak grad: 7.2 mmHg
Ao pk vel: 1.34 m/s
Area-P 1/2: 3.99 cm2
Height: 62 in
MV VTI: 2.9 cm2
S' Lateral: 2.4 cm
Weight: 2080 oz

## 2020-08-14 LAB — BASIC METABOLIC PANEL
Anion gap: 9 (ref 5–15)
BUN: 12 mg/dL (ref 8–23)
CO2: 24 mmol/L (ref 22–32)
Calcium: 8.4 mg/dL — ABNORMAL LOW (ref 8.9–10.3)
Chloride: 105 mmol/L (ref 98–111)
Creatinine, Ser: 0.68 mg/dL (ref 0.44–1.00)
GFR, Estimated: >60 mL/min (ref >60)
Glucose, Bld: 97 mg/dL (ref 70–99)
Potassium: 3.7 mmol/L (ref 3.5–5.1)
Sodium: 138 mmol/L (ref 135–145)

## 2020-08-14 LAB — CBC
HCT: 37.8 % (ref 36.0–46.0)
Hemoglobin: 12.8 g/dL (ref 12.0–15.0)
MCH: 30.8 pg (ref 26.0–34.0)
MCHC: 33.9 g/dL (ref 30.0–36.0)
MCV: 91.1 fL (ref 80.0–100.0)
Platelets: 232 10*3/uL (ref 150–400)
RBC: 4.15 MIL/uL (ref 3.87–5.11)
RDW: 13.5 % (ref 11.5–15.5)
WBC: 10.4 10*3/uL (ref 4.0–10.5)
nRBC: 0 % (ref 0.0–0.2)

## 2020-08-14 LAB — PHOSPHORUS: Phosphorus: 4.3 mg/dL (ref 2.5–4.6)

## 2020-08-14 LAB — MAGNESIUM: Magnesium: 2.2 mg/dL (ref 1.7–2.4)

## 2020-08-14 LAB — SARS CORONAVIRUS 2 (TAT 6-24 HRS): SARS Coronavirus 2: NEGATIVE

## 2020-08-14 MED ORDER — MELATONIN 5 MG PO TABS
5.0000 mg | ORAL_TABLET | Freq: Every evening | ORAL | Status: DC | PRN
  Administered 2020-08-14 – 2020-08-15 (×3): 5 mg via ORAL
  Filled 2020-08-14 (×3): qty 1

## 2020-08-14 MED ORDER — GADOBUTROL 1 MMOL/ML IV SOLN
5.0000 mL | Freq: Once | INTRAVENOUS | Status: AC | PRN
  Administered 2020-08-15: 7.5 mL via INTRAVENOUS

## 2020-08-14 MED ORDER — GADOBUTROL 1 MMOL/ML IV SOLN
5.0000 mL | Freq: Once | INTRAVENOUS | Status: AC | PRN
  Administered 2020-08-14: 5 mL via INTRAVENOUS

## 2020-08-14 NOTE — Evaluation (Signed)
Physical Therapy Evaluation Patient Details Name: Suzanne Hogan MRN: [id number] DOB: 1945/05/16 Today's Date: 08/14/2020   History of Present Illness  Pt admitted for syncopal episode now with a displaced distal clavicle fx.Ortho cleared for mobility efforts via secure chat. History includes CLL, cervical cancer s/p ectomy. Reports episodes have been happening inconsistnely for last year.  Clinical Impression  Pt is a pleasant 75 year old female who was admitted for syncopal episode. Pt demonstrates all bed mobility/transfers/ambulation at baseline level. Pt able to maintain correct WBing status, doesn't need AD for assistance at this time. Safe technique with stair training. Discussed ADLs and having family present incase of syncopal episode. Pt does not require any further PT needs at this time. Pt will be dc in house and does not require follow up. RN aware. Will dc current orders.     Follow Up Recommendations No PT follow up    Equipment Recommendations  None recommended by PT    Recommendations for Other Services       Precautions / Restrictions Precautions Precautions: Fall Required Braces or Orthoses: Sling Restrictions Weight Bearing Restrictions: Yes RUE Weight Bearing: Non weight bearing Other Position/Activity Restrictions: sling on R arm. Cleared for platform use if needed      Mobility  Bed Mobility               General bed mobility comments: not performed as received in recliner    Transfers Overall transfer level: Independent Equipment used: None             General transfer comment: safe technique with upright posture  Ambulation/Gait Ambulation/Gait assistance: Supervision Gait Distance (Feet): 200 Feet Assistive device: None Gait Pattern/deviations: WFL(Within Functional Limits)     General Gait Details: ambulated with good cadence and reciprocal gait pattern. Upright posture. No dizziness and able to carry conversation with  ambulation  Stairs Stairs: Yes Stairs assistance: Supervision Stair Management: One rail Left;Forwards Number of Stairs: 4 General stair comments: up/down with safe technique with reciprocal gait pattern.  Wheelchair Mobility    Modified Rankin (Stroke Patients Only)       Balance Overall balance assessment: Independent                                           Pertinent Vitals/Pain Pain Assessment: Faces Faces Pain Scale: Hurts a little bit Pain Location: R shoulder Pain Descriptors / Indicators: Aching Pain Intervention(s): Limited activity within patient's tolerance    Home Living Family/patient expects to be discharged to:: Private residence Living Arrangements: Spouse/significant other Available Help at Discharge: Family;Available 24 hours/day Type of Home: House Home Access: Stairs to enter Entrance Stairs-Rails: Can reach both Entrance Stairs-Number of Steps: flight Home Layout: One level Home Equipment: None      Prior Function Level of Independence: Independent         Comments: active and reports no regular falls     Hand Dominance        Extremity/Trunk Assessment   Upper Extremity Assessment Upper Extremity Assessment: Overall WFL for tasks assessed (R UE grip strength WNL. shoulder not tested)    Lower Extremity Assessment Lower Extremity Assessment: Overall WFL for tasks assessed       Communication   Communication: No difficulties  Cognition Arousal/Alertness: Awake/alert Behavior During Therapy: WFL for tasks assessed/performed Overall Cognitive Status: Within Functional Limits for tasks assessed  General Comments      Exercises Other Exercises Other Exercises: Educated on sling management. Pt able to demonstrate don/doffing   Assessment/Plan    PT Assessment Patent does not need any further PT services  PT Problem List         PT Treatment  Interventions      PT Goals (Current goals can be found in the Care Plan section)  Acute Rehab PT Goals Patient Stated Goal: to go home PT Goal Formulation: All assessment and education complete, DC therapy Time For Goal Achievement: 08/14/20 Potential to Achieve Goals: Good    Frequency     Barriers to discharge        Co-evaluation               AM-PAC PT "6 Clicks" Mobility  Outcome Measure Help needed turning from your back to your side while in a flat bed without using bedrails?: None Help needed moving from lying on your back to sitting on the side of a flat bed without using bedrails?: None Help needed moving to and from a bed to a chair (including a wheelchair)?: None Help needed standing up from a chair using your arms (e.g., wheelchair or bedside chair)?: None Help needed to walk in hospital room?: None Help needed climbing 3-5 steps with a railing? : None 6 Click Score: 24    End of Session Equipment Utilized During Treatment: Gait belt Activity Tolerance: Patient tolerated treatment well Patient left: in chair Nurse Communication: Mobility status PT Visit Diagnosis: Dizziness and giddiness (R42)    Time: 1438-1500 PT Time Calculation (min) (ACUTE ONLY): 22 min   Charges:   PT Evaluation $PT Eval Low Complexity: 1 Low PT Treatments $Gait Training: 8-22 mins        Greggory Stallion, PT, DPT [phone]   Jule Whitsel 08/14/2020, 4:48 PM

## 2020-08-14 NOTE — Consult Note (Signed)
ORTHOPAEDIC CONSULTATION  REQUESTING PHYSICIAN: Enzo Bi, MD  Chief Complaint: Right distal clavicle fracture  HPI: MALEAHA HUGHETT is a 75 y.o. female who complains of right shoulder pain after a mechanical fall following a syncopal event.  X-rays were obtained which showed a mildly displaced right distal clavicle fracture.  Patient was placed in a sling.  Orthopedics was consulted regarding further management.  Right-hand-dominant.  Past Medical History:  Diagnosis Date  . Cervical cancer ([place]) 1981  . Lymphocytosis    Past Surgical History:  Procedure Laterality Date  . ABDOMINAL HYSTERECTOMY  09/1979   AT [facility] LEFT OVARIES IN   Social History   Socioeconomic History  . Marital status: Married    Spouse name: Not on file  . Number of children: Not on file  . Years of education: Not on file  . Highest education level: Not on file  Occupational History  . Not on file  Tobacco Use  . Smoking status: Former Smoker    Years: 10.00    Quit date: 06/02/2002    Years since quitting: 18.2  . Smokeless tobacco: Never Used  Substance and Sexual Activity  . Alcohol use: Yes    Comment: 1 GLASS  5 TIMES A WEEK  . Drug use: No  . Sexual activity: Not on file  Other Topics Concern  . Not on file  Social History Narrative  . Not on file   Social Determinants of Health   Financial Resource Strain: Not on file  Food Insecurity: Not on file  Transportation Needs: Not on file  Physical Activity: Not on file  Stress: Not on file  Social Connections: Not on file   Family History  Problem Relation Age of Onset  . Breast cancer Neg Hx    Allergies  Allergen Reactions  . Codeine Diarrhea and Nausea And Vomiting  . Sulfa Antibiotics Rash   Prior to Admission medications   Medication Sig Start Date End Date Taking? Authorizing Provider  b complex vitamins tablet Take 1 tablet by mouth daily.   Yes [provider]  Calcium Carb-Cholecalciferol (CALCIUM + D3 PO)  Take 1 Dose by mouth 2 (two) times daily. 900 mg AND VITAMIN D 1200. 1 IN AMD AN 1/2 IN PM   Yes [provider]  cholecalciferol (VITAMIN D) 1000 UNITS tablet Take 1,000 Units by mouth daily.   Yes [provider]  Multiple Vitamins-Minerals (MULTIVITAMIN WITH MINERALS) tablet Take 1 tablet by mouth daily.   Yes [provider]  Omega 3 1200 MG CAPS Take 1 capsule by mouth.   Yes [provider]  vitamin C (ASCORBIC ACID) 500 MG tablet Take 500 mg by mouth daily.   Yes [provider]  Zinc 25 MG TABS Take 1 tablet by mouth.   Yes [provider]   DG Shoulder Right  Result Date: 08/13/2020 CLINICAL DATA:  Fall, syncopal episode.  Right shoulder pain. EXAM: RIGHT SHOULDER - 2+ VIEW COMPARISON:  None. FINDINGS: Displaced distal clavicle fracture with 1 shaft width superior displacement of distal fracture fragment. There is mild offset of the acromioclavicular joint. No coracoclavicular joint space widening. No additional fracture. Glenohumeral joint is congruent. No fracture of the included right ribs. IMPRESSION: Displaced distal clavicle fracture with 1 shaft width superior displacement of distal fracture fragment. Mild offset of the acromioclavicular joint. Electronically Signed   By: Keith Rake M.D.   On: 08/13/2020 20:51   CT Head Wo Contrast  Result Date: 08/13/2020 CLINICAL  DATA:  Syncopal episode bruising and laceration to right ear EXAM: CT HEAD WITHOUT CONTRAST CT CERVICAL SPINE WITHOUT CONTRAST TECHNIQUE: Multidetector CT imaging of the head and cervical spine was performed following the standard protocol without intravenous contrast. Multiplanar CT image reconstructions of the cervical spine were also generated. COMPARISON:  None. FINDINGS: CT HEAD FINDINGS Brain: No acute territorial infarction, hemorrhage, or intracranial mass. Mild atrophy. Nonenlarged ventricles. Vascular: No hyperdense vessels.  Carotid vascular calcification  Skull: Normal. Negative for fracture or focal lesion. Sinuses/Orbits: No acute finding. Other: None CT CERVICAL SPINE FINDINGS Alignment: Reversal of cervical lordosis. 4 mm anterolisthesis C4 on C5 and 6 mm anterolisthesis C7 on T1. Facet alignment is maintained Skull base and vertebrae: No acute fracture. No primary bone lesion or focal pathologic process. Soft tissues and spinal canal: No prevertebral fluid or swelling. No visible canal hematoma. Disc levels: Moderate severe diffuse degenerative change C3 through T1 with advanced degenerative changes also at T1-T2. Hypertrophic facet degenerative changes at multiple levels. Upper chest: Right apical scarring Other: None IMPRESSION: 1. No CT evidence for acute intracranial abnormality. 2. Reversal of cervical lordosis with degenerative changes. No fracture seen. Anterolisthesis C4 on C5 and C7 on T1 probably due to degenerative changes Electronically Signed   By: Donavan Foil M.D.   On: 08/13/2020 21:02   CT Cervical Spine Wo Contrast  Result Date: 08/13/2020 CLINICAL DATA:  Syncopal episode bruising and laceration to right ear EXAM: CT HEAD WITHOUT CONTRAST CT CERVICAL SPINE WITHOUT CONTRAST TECHNIQUE: Multidetector CT imaging of the head and cervical spine was performed following the standard protocol without intravenous contrast. Multiplanar CT image reconstructions of the cervical spine were also generated. COMPARISON:  None. FINDINGS: CT HEAD FINDINGS Brain: No acute territorial infarction, hemorrhage, or intracranial mass. Mild atrophy. Nonenlarged ventricles. Vascular: No hyperdense vessels.  Carotid vascular calcification Skull: Normal. Negative for fracture or focal lesion. Sinuses/Orbits: No acute finding. Other: None CT CERVICAL SPINE FINDINGS Alignment: Reversal of cervical lordosis. 4 mm anterolisthesis C4 on C5 and 6 mm anterolisthesis C7 on T1. Facet alignment is maintained Skull base and vertebrae: No acute fracture. No primary bone lesion or  focal pathologic process. Soft tissues and spinal canal: No prevertebral fluid or swelling. No visible canal hematoma. Disc levels: Moderate severe diffuse degenerative change C3 through T1 with advanced degenerative changes also at T1-T2. Hypertrophic facet degenerative changes at multiple levels. Upper chest: Right apical scarring Other: None IMPRESSION: 1. No CT evidence for acute intracranial abnormality. 2. Reversal of cervical lordosis with degenerative changes. No fracture seen. Anterolisthesis C4 on C5 and C7 on T1 probably due to degenerative changes Electronically Signed   By: Donavan Foil M.D.   On: 08/13/2020 21:02   MM 3D SCREEN BREAST BILATERAL  Result Date: 08/13/2020 CLINICAL DATA:  Screening. EXAM: DIGITAL SCREENING BILATERAL MAMMOGRAM WITH TOMOSYNTHESIS AND CAD TECHNIQUE: Bilateral screening digital craniocaudal and mediolateral oblique mammograms were obtained. Bilateral screening digital breast tomosynthesis was performed. The images were evaluated with computer-aided detection. COMPARISON:  Previous exam(s). ACR Breast Density Category b: There are scattered areas of fibroglandular density. FINDINGS: There are no findings suspicious for malignancy. The images were evaluated with computer-aided detection. IMPRESSION: No mammographic evidence of malignancy. A result letter of this screening mammogram will be mailed directly to the patient. RECOMMENDATION: Screening mammogram in one year. (Code:SM-B-01Y) BI-RADS CATEGORY  1: Negative. Electronically Signed   By: Lajean Manes M.D.   On: 08/13/2020 09:19    Positive ROS: All other systems have been  reviewed and were otherwise negative with the exception of those mentioned in the HPI and as above.  Physical Exam: General: Alert, no acute distress Cardiovascular: No pedal edema Respiratory: No cyanosis, no use of accessory musculature GI: No organomegaly, abdomen is soft and non-tender Skin: No lesions in the area of chief  complaint Neurologic: Sensation intact distally Psychiatric: Patient is competent for consent with normal mood and affect Lymphatic: No axillary or cervical lymphadenopathy  MUSCULOSKELETAL: Right upper extremity in a sling, mild swelling and tenderness to palpation at the distal clavicle, shoulder range of motion deferred, no tenderness or limitation in motion of the elbow hand or wrist, right upper extremity is neurovascular intact  Assessment: 75 year old right-hand-dominant female with a mildly displaced right distal clavicle fracture  Plan: -PT/OT: Nonweightbearing right upper extremity with use of a sling for 4 to 6 weeks.  Patient may weight-bear utilizing a platform walker through the right arm as tolerated.  We will start PT for shoulder range of motion approximately 4 weeks after injury. -Recommend follow-up in clinic in approximately 2 weeks with repeat x-rays    Renee Harder, MD  08/14/2020 12:41 PM

## 2020-08-14 NOTE — Progress Notes (Signed)
*  PRELIMINARY RESULTS* Echocardiogram 2D Echocardiogram has been performed.  Suzanne Hogan Suzanne Hogan Suzanne Hogan Suzanne Hogan 08/14/2020, 9:10 AM

## 2020-08-14 NOTE — Progress Notes (Signed)
*  PRELIMINARY RESULTS* Echocardiogram 2D Echocardiogram has been performed.  Suzanne Hogan 08/14/2020, 9:11 AM

## 2020-08-14 NOTE — Progress Notes (Signed)
PT Cancellation Note  Patient Details Name: Suzanne Hogan MRN: [id number] DOB: 1945-12-15   Cancelled Treatment:    Reason Eval/Treat Not Completed: Other (comment). Chart reviewed, pt with pending ortho consult due to displaced clavical fx. Per chart review, sling in use. Will need updated orders with any WBing precautions with anticipated need for RW due to multiple falls. Will await further POC and ortho input prior to evaluation.   [place] Moyd 08/14/2020, 8:49 AM Greggory Stallion, PT, DPT [phone]

## 2020-08-14 NOTE — Progress Notes (Signed)
PROGRESS NOTE    Suzanne Hogan  WVP:[id number] DOB: 1946/03/20 DOA: 08/13/2020 PCP: Tracie Harrier, MD  [id number]/[id number]-AA   Assessment & Plan:   Active Problems:   Syncope   Syncope and collapse   Suzanne Hogan is a 75 y.o. female with past medical history of CLL, cervical cancer status post ectomy presented to hospital after sustaining a syncopal episode today while at dinner at a restaurant.  She fell off from a chair onto the concrete and lost consciousness.  Prior to the fall she had mild dizziness.  Patient stated that she hit her right shoulder and complained of the right shoulder pain.  Patient did have a similar syncopal episode on Easter Sunday when her primary care physician recommended Holter monitor.  This was removed 3 days back and has not had a report yet.   Syncope --twice in recent months, with resulting injuries from the falls.  Pt now reported both times she fainted without warning, but this recent episode, pt said her husband told her she was present in a 10-minute conversation of which pt had no recollection. --Echo wnl, tele no event.  Orthostatic BP measurement neg for orthostasis. Plan: --neuro consult, will see pt tomorrow. --MRA head and neck --cont tele  Fall with right displaced distal clavicular fracture.   --received sling in the ED --ortho consult today, rec -PT/OT: Nonweightbearing right upper extremity with use of a sling for 4 to 6 weeks.  Patient may weight-bear utilizing a platform walker through the right arm as tolerated. --ortho to start PT for shoulder range of motion approximately 4 weeks after injury. -Recommend follow-up in clinic in approximately 2 weeks with repeat x-rays  History of CLL.  Stable at this time.  History of cervical cancer.  Status post hysterectomy.   DVT prophylaxis: Lovenox SQ Code Status: Full code  Family Communication:  Level of care: Med-Surg Dispo:   The patient is from: home Anticipated d/c is to:  home Anticipated d/c date is: tomorrow Patient currently is not medically ready to d/c due to: pending neuro consult   Subjective and Interval History:  Pt had no complaints.  Right arm in a sling.  Tele checked, no abnormal event.    Ortho and neuro consulted.  PT worked with pt.   Objective: Vitals:   08/14/20 0314 08/14/20 0811 08/14/20 1147 08/14/20 1626  BP: 108/75 117/79 120/79 132/85  Pulse: 70 (!) 59 72 80  Resp: 16 16 18 17   Temp: 98.6 F (37 C) 98.6 F (37 C) 98.6 F (37 C) 98.3 F (36.8 C)  TempSrc: Oral     SpO2: 97% 97% 98% 98%  Weight:      Height:        Intake/Output Summary (Last 24 hours) at 08/14/2020 1835 Last data filed at 08/14/2020 0630 Gross per 24 hour  Intake 633.59 ml  Output 0 ml  Net 633.59 ml   Filed Weights   08/13/20 2017  Weight: 59 kg    Examination:   Constitutional: NAD, AAOx3 HEENT: conjunctivae and lids normal, EOMI CV: No cyanosis.   RESP: normal respiratory effort, on RA Extremities: right arm in sling SKIN: warm, dry Neuro: II - XII grossly intact.   Psych: Normal mood and affect.  Appropriate judgement and reason   Data Reviewed: I have personally reviewed following labs and imaging studies  CBC: Recent Labs  Lab 08/13/20 2050 08/14/20 0530  WBC 10.9* 10.4  NEUTROABS 4.9  --   HGB 13.7 12.8  HCT 40.6 37.8  MCV 91.2 91.1  PLT 253 [id number]   Basic Metabolic Panel: Recent Labs  Lab 08/13/20 2050 08/14/20 0530  NA 135 138  K 3.7 3.7  CL 101 105  CO2 24 24  GLUCOSE 116* 97  BUN 15 12  CREATININE 0.85 0.68  CALCIUM 8.8* 8.4*  MG  --  2.2  PHOS  --  4.3   GFR: Estimated Creatinine Clearance: 46.6 mL/min (by C-G formula based on SCr of 0.68 mg/dL). Liver Function Tests: No results for input(s): AST, ALT, ALKPHOS, BILITOT, PROT, ALBUMIN in the last 168 hours. No results for input(s): LIPASE, AMYLASE in the last 168 hours. No results for input(s): AMMONIA in the last 168 hours. Coagulation Profile: No  results for input(s): INR, PROTIME in the last 168 hours. Cardiac Enzymes: No results for input(s): CKTOTAL, CKMB, CKMBINDEX, TROPONINI in the last 168 hours. BNP (last 3 results) No results for input(s): PROBNP in the last 8760 hours. HbA1C: No results for input(s): HGBA1C in the last 72 hours. CBG: No results for input(s): GLUCAP in the last 168 hours. Lipid Profile: No results for input(s): CHOL, HDL, LDLCALC, TRIG, CHOLHDL, LDLDIRECT in the last 72 hours. Thyroid Function Tests: No results for input(s): TSH, T4TOTAL, FREET4, T3FREE, THYROIDAB in the last 72 hours. Anemia Panel: No results for input(s): VITAMINB12, FOLATE, FERRITIN, TIBC, IRON, RETICCTPCT in the last 72 hours. Sepsis Labs: No results for input(s): PROCALCITON, LATICACIDVEN in the last 168 hours.  Recent Results (from the past 240 hour(s))  SARS CORONAVIRUS 2 (TAT 6-24 HRS) Nasopharyngeal Nasopharyngeal Swab     Status: None   Collection Time: 08/13/20 10:06 PM   Specimen: Nasopharyngeal Swab  Result Value Ref Range Status   SARS Coronavirus 2 NEGATIVE NEGATIVE Final    Comment: (NOTE) SARS-CoV-2 target nucleic acids are NOT DETECTED.  The SARS-CoV-2 RNA is generally detectable in upper and lower respiratory specimens during the acute phase of infection. Negative results do not preclude SARS-CoV-2 infection, do not rule out co-infections with other pathogens, and should not be used as the sole basis for treatment or other patient management decisions. Negative results must be combined with clinical observations, patient history, and epidemiological information. The expected result is Negative.  Fact Sheet for Patients: SugarRoll.be  Fact Sheet for Healthcare Providers: https://www.woods-mathews.com/  This test is not yet approved or cleared by the [place] FDA and  has been authorized for detection and/or diagnosis of SARS-CoV-2 by FDA under an Emergency Use  Authorization (EUA). This EUA will remain  in effect (meaning this test can be used) for the duration of the COVID-19 declaration under Se ction 564(b)(1) of the Act, 21 U.S.C. section 360bbb-3(b)(1), unless the authorization is terminated or revoked sooner.  Performed at [facility], [place] [address] [place],  [address]       Radiology Studies: DG Shoulder Right  Result Date: 08/13/2020 CLINICAL DATA:  Fall, syncopal episode.  Right shoulder pain. EXAM: RIGHT SHOULDER - 2+ VIEW COMPARISON:  None. FINDINGS: Displaced distal clavicle fracture with 1 shaft width superior displacement of distal fracture fragment. There is mild offset of the acromioclavicular joint. No coracoclavicular joint space widening. No additional fracture. Glenohumeral joint is congruent. No fracture of the included right ribs. IMPRESSION: Displaced distal clavicle fracture with 1 shaft width superior displacement of distal fracture fragment. Mild offset of the acromioclavicular joint. Electronically Signed   By: Keith Rake M.D.   On: 08/13/2020 20:51   CT Head Wo Contrast  Result Date: 08/13/2020 CLINICAL DATA:  Syncopal episode bruising and laceration to right ear EXAM: CT HEAD WITHOUT CONTRAST CT CERVICAL SPINE WITHOUT CONTRAST TECHNIQUE: Multidetector CT imaging of the head and cervical spine was performed following the standard protocol without intravenous contrast. Multiplanar CT image reconstructions of the cervical spine were also generated. COMPARISON:  None. FINDINGS: CT HEAD FINDINGS Brain: No acute territorial infarction, hemorrhage, or intracranial mass. Mild atrophy. Nonenlarged ventricles. Vascular: No hyperdense vessels.  Carotid vascular calcification Skull: Normal. Negative for fracture or focal lesion. Sinuses/Orbits: No acute finding. Other: None CT CERVICAL SPINE FINDINGS Alignment: Reversal of cervical lordosis. 4 mm anterolisthesis C4 on C5 and 6 mm anterolisthesis C7 on T1. Facet  alignment is maintained Skull base and vertebrae: No acute fracture. No primary bone lesion or focal pathologic process. Soft tissues and spinal canal: No prevertebral fluid or swelling. No visible canal hematoma. Disc levels: Moderate severe diffuse degenerative change C3 through T1 with advanced degenerative changes also at T1-T2. Hypertrophic facet degenerative changes at multiple levels. Upper chest: Right apical scarring Other: None IMPRESSION: 1. No CT evidence for acute intracranial abnormality. 2. Reversal of cervical lordosis with degenerative changes. No fracture seen. Anterolisthesis C4 on C5 and C7 on T1 probably due to degenerative changes Electronically Signed   By: Donavan Foil M.D.   On: 08/13/2020 21:02   CT Cervical Spine Wo Contrast  Result Date: 08/13/2020 CLINICAL DATA:  Syncopal episode bruising and laceration to right ear EXAM: CT HEAD WITHOUT CONTRAST CT CERVICAL SPINE WITHOUT CONTRAST TECHNIQUE: Multidetector CT imaging of the head and cervical spine was performed following the standard protocol without intravenous contrast. Multiplanar CT image reconstructions of the cervical spine were also generated. COMPARISON:  None. FINDINGS: CT HEAD FINDINGS Brain: No acute territorial infarction, hemorrhage, or intracranial mass. Mild atrophy. Nonenlarged ventricles. Vascular: No hyperdense vessels.  Carotid vascular calcification Skull: Normal. Negative for fracture or focal lesion. Sinuses/Orbits: No acute finding. Other: None CT CERVICAL SPINE FINDINGS Alignment: Reversal of cervical lordosis. 4 mm anterolisthesis C4 on C5 and 6 mm anterolisthesis C7 on T1. Facet alignment is maintained Skull base and vertebrae: No acute fracture. No primary bone lesion or focal pathologic process. Soft tissues and spinal canal: No prevertebral fluid or swelling. No visible canal hematoma. Disc levels: Moderate severe diffuse degenerative change C3 through T1 with advanced degenerative changes also at T1-T2.  Hypertrophic facet degenerative changes at multiple levels. Upper chest: Right apical scarring Other: None IMPRESSION: 1. No CT evidence for acute intracranial abnormality. 2. Reversal of cervical lordosis with degenerative changes. No fracture seen. Anterolisthesis C4 on C5 and C7 on T1 probably due to degenerative changes Electronically Signed   By: Donavan Foil M.D.   On: 08/13/2020 21:02   ECHOCARDIOGRAM COMPLETE  Result Date: 08/14/2020    ECHOCARDIOGRAM REPORT   Patient Name:   IVON ROEDEL Date of Exam: 08/14/2020 Medical Rec #:  [id number]         Height:       62.0 in Accession #:    [id number]        Weight:       130.0 lb Date of Birth:  Apr 23, 1945         BSA:          1.592 m Patient Age:    3 years          BP:           108/75 mmHg Patient Gender: F  HR:           80 bpm. Exam Location:  [facility] Procedure: 2D Echo, Color Doppler and Cardiac Doppler Indications:     R55 Syncope  History:         Patient has no prior history of Echocardiogram examinations.                  Signs/Symptoms:2 syncopal episodes. Hx of cervical cancer.  Sonographer:     Charmayne Sheer RDCS (AE) Referring Phys:  [id number] [facility] [address] [place] Diagnosing Phys: Serafina Royals MD  Sonographer Comments: Suboptimal parasternal window and suboptimal subcostal window. IMPRESSIONS  1. Left ventricular ejection fraction, by estimation, is 55 to 60%. The left ventricle has normal function. The left ventricle has no regional wall motion abnormalities. Left ventricular diastolic parameters were normal.  2. Right ventricular systolic function is normal. The right ventricular size is normal.  3. The mitral valve is normal in structure. Trivial mitral valve regurgitation.  4. The aortic valve is normal in structure. Aortic valve regurgitation is trivial. FINDINGS  Left Ventricle: Left ventricular ejection fraction, by estimation, is 55 to 60%. The left ventricle has normal function. The left ventricle has no regional wall motion  abnormalities. The left ventricular internal cavity size was normal in size. There is  no left ventricular hypertrophy. Left ventricular diastolic parameters were normal. Right Ventricle: The right ventricular size is normal. No increase in right ventricular wall thickness. Right ventricular systolic function is normal. Left Atrium: Left atrial size was normal in size. Right Atrium: Right atrial size was normal in size. Pericardium: There is no evidence of pericardial effusion. Mitral Valve: The mitral valve is normal in structure. Trivial mitral valve regurgitation. MV peak gradient, 2.9 mmHg. The mean mitral valve gradient is 1.0 mmHg. Tricuspid Valve: The tricuspid valve is normal in structure. Tricuspid valve regurgitation is mild. Aortic Valve: The aortic valve is normal in structure. Aortic valve regurgitation is trivial. Aortic valve mean gradient measures 4.0 mmHg. Aortic valve peak gradient measures 7.2 mmHg. Aortic valve area, by VTI measures 2.18 cm. Pulmonic Valve: The pulmonic valve was normal in structure. Pulmonic valve regurgitation is not visualized. Aorta: The aortic root and ascending aorta are structurally normal, with no evidence of dilitation. IAS/Shunts: No atrial level shunt detected by color flow Doppler.  LEFT VENTRICLE PLAX 2D LVIDd:         3.30 cm  Diastology LVIDs:         2.40 cm  LV e' medial:    6.85 cm/s LV PW:         0.70 cm  LV E/e' medial:  8.7 LV IVS:        0.60 cm  LV e' lateral:   10.70 cm/s LVOT diam:     2.10 cm  LV E/e' lateral: 5.6 LV SV:         53 LV SV Index:   34 LVOT Area:     3.46 cm  RIGHT VENTRICLE RV Basal diam:  3.10 cm LEFT ATRIUM             Index       RIGHT ATRIUM           Index LA diam:        2.70 cm 1.70 cm/m  RA Area:     13.70 cm LA Vol (A2C):   29.5 ml 18.53 ml/m RA Volume:   28.30 ml  17.78 ml/m LA Vol (A4C):   21.7 ml 13.63  ml/m LA Biplane Vol: 27.0 ml 16.96 ml/m  AORTIC VALVE                   PULMONIC VALVE AV Area (Vmax):    2.66 cm     PV Vmax:       0.78 m/s AV Area (Vmean):   2.33 cm    PV Vmean:      53.700 cm/s AV Area (VTI):     2.18 cm    PV VTI:        0.177 m AV Vmax:           134.00 cm/s PV Peak grad:  2.4 mmHg AV Vmean:          94.000 cm/s PV Mean grad:  1.0 mmHg AV VTI:            0.245 m AV Peak Grad:      7.2 mmHg AV Mean Grad:      4.0 mmHg LVOT Vmax:         103.00 cm/s LVOT Vmean:        63.100 cm/s LVOT VTI:          0.154 m LVOT/AV VTI ratio: 0.63  AORTA Ao Root diam: 3.60 cm MITRAL VALVE MV Area (PHT): 3.99 cm    SHUNTS MV Area VTI:   2.90 cm    Systemic VTI:  0.15 m MV Peak grad:  2.9 mmHg    Systemic Diam: 2.10 cm MV Mean grad:  1.0 mmHg MV Vmax:       0.85 m/s MV Vmean:      50.2 cm/s MV Decel Time: 190 msec MV E velocity: 59.60 cm/s MV A velocity: 62.10 cm/s MV E/A ratio:  0.96 Serafina Royals MD Electronically signed by Serafina Royals MD Signature Date/Time: 08/14/2020/5:24:27 PM    Final      Scheduled Meds: . cholecalciferol  1,000 Units Oral Daily  . docusate sodium  100 mg Oral BID  . enoxaparin (LOVENOX) injection  40 mg Subcutaneous Q24H  . multivitamin  1 tablet Oral Daily   Continuous Infusions:   LOS: 0 days     Enzo Bi, MD [facility] If 7PM-7AM, please contact night-coverage 08/14/2020, 6:35 PM

## 2020-08-14 NOTE — Plan of Care (Signed)

## 2020-08-15 ENCOUNTER — Inpatient Hospital Stay: Payer: Medicare Other

## 2020-08-15 DIAGNOSIS — S42031A Displaced fracture of lateral end of right clavicle, initial encounter for closed fracture: Secondary | ICD-10-CM

## 2020-08-15 DIAGNOSIS — R404 Transient alteration of awareness: Secondary | ICD-10-CM

## 2020-08-15 DIAGNOSIS — W19XXXA Unspecified fall, initial encounter: Secondary | ICD-10-CM

## 2020-08-15 LAB — BASIC METABOLIC PANEL
Anion gap: 7 (ref 5–15)
BUN: 14 mg/dL (ref 8–23)
CO2: 26 mmol/L (ref 22–32)
Calcium: 8.7 mg/dL — ABNORMAL LOW (ref 8.9–10.3)
Chloride: 106 mmol/L (ref 98–111)
Creatinine, Ser: 0.8 mg/dL (ref 0.44–1.00)
GFR, Estimated: >60 mL/min (ref >60)
Glucose, Bld: 95 mg/dL (ref 70–99)
Potassium: 3.9 mmol/L (ref 3.5–5.1)
Sodium: 139 mmol/L (ref 135–145)

## 2020-08-15 LAB — CBC
HCT: 41.9 % (ref 36.0–46.0)
Hemoglobin: 14 g/dL (ref 12.0–15.0)
MCH: 30.4 pg (ref 26.0–34.0)
MCHC: 33.4 g/dL (ref 30.0–36.0)
MCV: 91.1 fL (ref 80.0–100.0)
Platelets: 251 10*3/uL (ref 150–400)
RBC: 4.6 MIL/uL (ref 3.87–5.11)
RDW: 13.4 % (ref 11.5–15.5)
WBC: 11.1 10*3/uL — ABNORMAL HIGH (ref 4.0–10.5)
nRBC: 0 % (ref 0.0–0.2)

## 2020-08-15 LAB — MAGNESIUM: Magnesium: 2.3 mg/dL (ref 1.7–2.4)
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
## 2020-08-15 NOTE — Progress Notes (Signed)
PROGRESS NOTE    Suzanne Hogan  DGU:[id number] DOB: 14-Sep-1945 DOA: 08/13/2020 PCP: Barbette Reichmann, MD  [id number]/[id number]-AA   Assessment & Plan:   Active Problems:   Syncope   Syncope and collapse   Suzanne Hogan is a 75 y.o. female with past medical history of CLL, cervical cancer status post ectomy presented to hospital after sustaining a syncopal episode today while at dinner at a restaurant.  She fell off from a chair onto the concrete and lost consciousness.  Prior to the fall she had mild dizziness.  Patient stated that she hit her right shoulder and complained of the right shoulder pain.  Patient did have a similar syncopal episode on Easter Sunday when her primary care physician recommended Holter monitor.  This was removed 3 days back and has not had a report yet.   Syncope --twice in recent months, with resulting injuries from the falls.  Pt now reported both times she fainted without warning, but this recent episode, pt said her husband told her she was present in a 10-minute conversation of which pt had no recollection. --Echo wnl, tele no event.  Orthostatic BP measurement neg for orthostasis. Plan: --neuro consult --MRA head and neck neg  --EEG today --MRI brain today (cannot be done until after 8pm tonight 2/2 previous gad administration) --cont tele  Fall with right displaced distal clavicular fracture.   --received sling in the ED --ortho consult today, rec -PT/OT: Nonweightbearing right upper extremity with use of a sling for 4 to 6 weeks.  Patient may weight-bear utilizing a platform walker through the right arm as tolerated. --ortho to start PT for shoulder range of motion approximately 4 weeks after injury. -Recommend follow-up in clinic in approximately 2 weeks with repeat x-rays  History of CLL.  Stable at this time.  History of cervical cancer.  Status post hysterectomy.   DVT prophylaxis: Lovenox SQ Code Status: Full code  Family Communication:   Level of care: Med-Surg Dispo:   The patient is from: home Anticipated d/c is to: home Anticipated d/c date is: tomorrow Patient currently is not medically ready to d/c due to: pending MRI brain   Subjective and Interval History:  Pain controlled.   Neuro workup so far neg for etiology of syncope.   Objective: Vitals:   08/14/20 1932 08/15/20 0041 08/15/20 0344 08/15/20 0918  BP: 113/75 125/82 109/77 117/80  Pulse: 82 66 62 79  Resp: 16 17 16 20   Temp: 99 F (37.2 C) 98.6 F (37 C) 98.2 F (36.8 C) 98.2 F (36.8 C)  TempSrc: Oral Oral Oral   SpO2: 96% 97% 98% 96%  Weight:      Height:        Intake/Output Summary (Last 24 hours) at 08/15/2020 1620 Last data filed at 08/15/2020 1030 Gross per 24 hour  Intake 240 ml  Output 0 ml  Net 240 ml   Filed Weights   08/13/20 2017  Weight: 59 kg    Examination:   Constitutional: NAD, AAOx3, sitting in chair HEENT: conjunctivae and lids normal, EOMI CV: No cyanosis.   RESP: normal respiratory effort, on RA Extremities: No effusions, edema in BLE.  Right arm in sling SKIN: warm, dry Neuro: II - XII grossly intact.   Psych: Normal mood and affect.  Appropriate judgement and reason   Data Reviewed: I have personally reviewed following labs and imaging studies  CBC: Recent Labs  Lab 08/13/20 2050 08/14/20 0530 08/15/20 0555  WBC 10.9* 10.4 11.1*  NEUTROABS 4.9  --   --   HGB 13.7 12.8 14.0  HCT 40.6 37.8 41.9  MCV 91.2 91.1 91.1  PLT 253 232 657   Basic Metabolic Panel: Recent Labs  Lab 08/13/20 2050 08/14/20 0530 08/15/20 0555  NA 135 138 139  K 3.7 3.7 3.9  CL 101 105 106  CO2 24 24 26   GLUCOSE 116* 97 95  BUN 15 12 14   CREATININE 0.85 0.68 0.80  CALCIUM 8.8* 8.4* 8.7*  MG  --  2.2 2.3  PHOS  --  4.3  --    GFR: Estimated Creatinine Clearance: 46.6 mL/min (by C-G formula based on SCr of 0.8 mg/dL). Liver Function Tests: No results for input(s): AST, ALT, ALKPHOS, BILITOT, PROT, ALBUMIN in the  last 168 hours. No results for input(s): LIPASE, AMYLASE in the last 168 hours. No results for input(s): AMMONIA in the last 168 hours. Coagulation Profile: No results for input(s): INR, PROTIME in the last 168 hours. Cardiac Enzymes: No results for input(s): CKTOTAL, CKMB, CKMBINDEX, TROPONINI in the last 168 hours. BNP (last 3 results) No results for input(s): PROBNP in the last 8760 hours. HbA1C: No results for input(s): HGBA1C in the last 72 hours. CBG: No results for input(s): GLUCAP in the last 168 hours. Lipid Profile: No results for input(s): CHOL, HDL, LDLCALC, TRIG, CHOLHDL, LDLDIRECT in the last 72 hours. Thyroid Function Tests: No results for input(s): TSH, T4TOTAL, FREET4, T3FREE, THYROIDAB in the last 72 hours. Anemia Panel: No results for input(s): VITAMINB12, FOLATE, FERRITIN, TIBC, IRON, RETICCTPCT in the last 72 hours. Sepsis Labs: No results for input(s): PROCALCITON, LATICACIDVEN in the last 168 hours.  Recent Results (from the past 240 hour(s))  SARS CORONAVIRUS 2 (TAT 6-24 HRS) Nasopharyngeal Nasopharyngeal Swab     Status: None   Collection Time: 08/13/20 10:06 PM   Specimen: Nasopharyngeal Swab  Result Value Ref Range Status   SARS Coronavirus 2 NEGATIVE NEGATIVE Final    Comment: (NOTE) SARS-CoV-2 target nucleic acids are NOT DETECTED.  The SARS-CoV-2 RNA is generally detectable in upper and lower respiratory specimens during the acute phase of infection. Negative results do not preclude SARS-CoV-2 infection, do not rule out co-infections with other pathogens, and should not be used as the sole basis for treatment or other patient management decisions. Negative results must be combined with clinical observations, patient history, and epidemiological information. The expected result is Negative.  Fact Sheet for Patients: SugarRoll.be  Fact Sheet for Healthcare Providers: https://www.woods-mathews.com/  This  test is not yet approved or cleared by the [place] FDA and  has been authorized for detection and/or diagnosis of SARS-CoV-2 by FDA under an Emergency Use Authorization (EUA). This EUA will remain  in effect (meaning this test can be used) for the duration of the COVID-19 declaration under Se ction 564(b)(1) of the Act, 21 U.S.C. section 360bbb-3(b)(1), unless the authorization is terminated or revoked sooner.  Performed at [facility], [place] [address] [place], [place] [address]       Radiology Studies: DG Shoulder Right  Result Date: 08/13/2020 CLINICAL DATA:  Fall, syncopal episode.  Right shoulder pain. EXAM: RIGHT SHOULDER - 2+ VIEW COMPARISON:  None. FINDINGS: Displaced distal clavicle fracture with 1 shaft width superior displacement of distal fracture fragment. There is mild offset of the acromioclavicular joint. No coracoclavicular joint space widening. No additional fracture. Glenohumeral joint is congruent. No fracture of the included right ribs. IMPRESSION: Displaced distal clavicle fracture with 1 shaft width superior  displacement of distal fracture fragment. Mild offset of the acromioclavicular joint. Electronically Signed   By: Keith Rake M.D.   On: 08/13/2020 20:51   CT Head Wo Contrast  Result Date: 08/13/2020 CLINICAL DATA:  Syncopal episode bruising and laceration to right ear EXAM: CT HEAD WITHOUT CONTRAST CT CERVICAL SPINE WITHOUT CONTRAST TECHNIQUE: Multidetector CT imaging of the head and cervical spine was performed following the standard protocol without intravenous contrast. Multiplanar CT image reconstructions of the cervical spine were also generated. COMPARISON:  None. FINDINGS: CT HEAD FINDINGS Brain: No acute territorial infarction, hemorrhage, or intracranial mass. Mild atrophy. Nonenlarged ventricles. Vascular: No hyperdense vessels.  Carotid vascular calcification Skull: Normal. Negative for fracture or focal lesion. Sinuses/Orbits: No acute  finding. Other: None CT CERVICAL SPINE FINDINGS Alignment: Reversal of cervical lordosis. 4 mm anterolisthesis C4 on C5 and 6 mm anterolisthesis C7 on T1. Facet alignment is maintained Skull base and vertebrae: No acute fracture. No primary bone lesion or focal pathologic process. Soft tissues and spinal canal: No prevertebral fluid or swelling. No visible canal hematoma. Disc levels: Moderate severe diffuse degenerative change C3 through T1 with advanced degenerative changes also at T1-T2. Hypertrophic facet degenerative changes at multiple levels. Upper chest: Right apical scarring Other: None IMPRESSION: 1. No CT evidence for acute intracranial abnormality. 2. Reversal of cervical lordosis with degenerative changes. No fracture seen. Anterolisthesis C4 on C5 and C7 on T1 probably due to degenerative changes Electronically Signed   By: Donavan Foil M.D.   On: 08/13/2020 21:02   CT Cervical Spine Wo Contrast  Result Date: 08/13/2020 CLINICAL DATA:  Syncopal episode bruising and laceration to right ear EXAM: CT HEAD WITHOUT CONTRAST CT CERVICAL SPINE WITHOUT CONTRAST TECHNIQUE: Multidetector CT imaging of the head and cervical spine was performed following the standard protocol without intravenous contrast. Multiplanar CT image reconstructions of the cervical spine were also generated. COMPARISON:  None. FINDINGS: CT HEAD FINDINGS Brain: No acute territorial infarction, hemorrhage, or intracranial mass. Mild atrophy. Nonenlarged ventricles. Vascular: No hyperdense vessels.  Carotid vascular calcification Skull: Normal. Negative for fracture or focal lesion. Sinuses/Orbits: No acute finding. Other: None CT CERVICAL SPINE FINDINGS Alignment: Reversal of cervical lordosis. 4 mm anterolisthesis C4 on C5 and 6 mm anterolisthesis C7 on T1. Facet alignment is maintained Skull base and vertebrae: No acute fracture. No primary bone lesion or focal pathologic process. Soft tissues and spinal canal: No prevertebral fluid  or swelling. No visible canal hematoma. Disc levels: Moderate severe diffuse degenerative change C3 through T1 with advanced degenerative changes also at T1-T2. Hypertrophic facet degenerative changes at multiple levels. Upper chest: Right apical scarring Other: None IMPRESSION: 1. No CT evidence for acute intracranial abnormality. 2. Reversal of cervical lordosis with degenerative changes. No fracture seen. Anterolisthesis C4 on C5 and C7 on T1 probably due to degenerative changes Electronically Signed   By: Donavan Foil M.D.   On: 08/13/2020 21:02   MR ANGIO HEAD WO CONTRAST  Result Date: 08/14/2020 CLINICAL DATA:  Initial evaluation for neuro deficit, stroke suspected. EXAM: MRA HEAD WITHOUT CONTRAST MRA NECK WITHOUT AND WITH CONTRAST TECHNIQUE: Angiographic images of the Circle of Willis were acquired using MRA technique without intravenous contrast. Angiographic images of the neck were acquired using MRA technique without and with intravenous contrast. Carotid stenosis measurements (when applicable) are obtained utilizing NASCET criteria, using the distal internal carotid diameter as the denominator. CONTRAST:  75mL GADAVIST GADOBUTROL 1 MMOL/ML IV SOLN COMPARISON:  Prior CT from 08/13/2020. FINDINGS: MRA HEAD  FINDINGS ANTERIOR CIRCULATION: Examination mildly degraded by motion artifact. Visualized distal cervical segments of the internal carotid arteries are patent with antegrade flow. Petrous, cavernous, and supraclinoid segments patent without stenosis. 3 mm focal outpouching extending laterally and slightly posteriorly from the cavernous segment of the right ICA suspicious for a small aneurysm (series 5, image 98). Left A1 segment widely patent. Right A1 hypoplastic and/or absent, with the anterior cerebral arteries primarily supplied via the left carotid artery system. Grossly normal anterior communicating artery complex. Anterior cerebral arteries patent to their distal aspects without stenosis. No M1  stenosis or occlusion. Normal MCA bifurcations. Distal MCA branches well perfused and symmetric. POSTERIOR CIRCULATION: Both V4 segments patent to the vertebrobasilar junction without stenosis. Right vertebral artery dominant. Both PICA origins patent and normal. Basilar patent to its distal aspect without stenosis. Superior cerebral arteries patent bilaterally. Left PCA supplied via the basilar as well as a small left posterior communicating artery. Fetal type origin of the right PCA. Both PCAs well perfused to their distal aspects without stenosis. MRA NECK FINDINGS AORTIC ARCH: Visualized aortic arch normal in caliber with normal 3 vessel morphology. No hemodynamically significant stenosis seen about the origin of the great vessels. Visualized subclavian arteries widely patent. RIGHT CAROTID SYSTEM: Right common and internal carotid arteries widely patent without stenosis, evidence for dissection or occlusion. LEFT CAROTID SYSTEM: Left common and internal carotid arteries widely patent without stenosis, evidence for dissection or occlusion. VERTEBRAL ARTERIES: Both vertebral arteries arise from subclavian arteries. Right vertebral artery slightly dominant. No proximal subclavian artery stenosis. Vertebral arteries widely patent without stenosis, evidence for dissection or occlusion. IMPRESSION: 1. Negative MRA of the head and neck. No large vessel occlusion or hemodynamically significant stenosis. 2. 3 mm focal outpouching arising from the cavernous segment of the right ICA, suspicious for a small aneurysm. 3. 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 Findings consistent with normal anatomic variants. Electronically Signed   By: Rise Mu M.D.   On: 08/14/2020 22:43   MR ANGIO NECK W WO CONTRAST  Result Date: 08/14/2020 CLINICAL DATA:  Initial evaluation for neuro deficit, stroke suspected. EXAM: MRA HEAD WITHOUT CONTRAST MRA NECK WITHOUT AND WITH CONTRAST TECHNIQUE:  Angiographic images of the Circle of Willis were acquired using MRA technique without intravenous contrast. Angiographic images of the neck were acquired using MRA technique without and with intravenous contrast. Carotid stenosis measurements (when applicable) are obtained utilizing NASCET criteria, using the distal internal carotid diameter as the denominator. CONTRAST:  47mL GADAVIST GADOBUTROL 1 MMOL/ML IV SOLN COMPARISON:  Prior CT from 08/13/2020. FINDINGS: MRA HEAD FINDINGS ANTERIOR CIRCULATION: Examination mildly degraded by motion artifact. Visualized distal cervical segments of the internal carotid arteries are patent with antegrade flow. Petrous, cavernous, and supraclinoid segments patent without stenosis. 3 mm focal outpouching extending laterally and slightly posteriorly from the cavernous segment of the right ICA suspicious for a small aneurysm (series 5, image 98). Left A1 segment widely patent. Right A1 hypoplastic and/or absent, with the anterior cerebral arteries primarily supplied via the left carotid artery system. Grossly normal anterior communicating artery complex. Anterior cerebral arteries patent to their distal aspects without stenosis. No M1 stenosis or occlusion. Normal MCA bifurcations. Distal MCA branches well perfused and symmetric. POSTERIOR CIRCULATION: Both V4 segments patent to the vertebrobasilar junction without stenosis. Right vertebral artery dominant. Both PICA origins patent and normal. Basilar patent to its distal aspect without stenosis. Superior cerebral arteries patent bilaterally. Left PCA supplied via the basilar  as well as a small left posterior communicating artery. Fetal type origin of the right PCA. Both PCAs well perfused to their distal aspects without stenosis. MRA NECK FINDINGS AORTIC ARCH: Visualized aortic arch normal in caliber with normal 3 vessel morphology. No hemodynamically significant stenosis seen about the origin of the great vessels. Visualized  subclavian arteries widely patent. RIGHT CAROTID SYSTEM: Right common and internal carotid arteries widely patent without stenosis, evidence for dissection or occlusion. LEFT CAROTID SYSTEM: Left common and internal carotid arteries widely patent without stenosis, evidence for dissection or occlusion. VERTEBRAL ARTERIES: Both vertebral arteries arise from subclavian arteries. Right vertebral artery slightly dominant. No proximal subclavian artery stenosis. Vertebral arteries widely patent without stenosis, evidence for dissection or occlusion. IMPRESSION: 1. Negative MRA of the head and neck. No large vessel occlusion or hemodynamically significant stenosis. 2. 3 mm focal outpouching arising from the cavernous segment of the right ICA, suspicious for a small aneurysm. 3. 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 Findings consistent with normal anatomic variants. Electronically Signed   By: Jeannine Boga M.D.   On: 08/14/2020 22:43   ECHOCARDIOGRAM COMPLETE  Result Date: 08/14/2020    ECHOCARDIOGRAM REPORT   Patient Name:   Suzanne Hogan Date of Exam: 08/14/2020 Medical Rec #:  [id number]         Height:       62.0 in Accession #:    [id number]        Weight:       130.0 lb Date of Birth:  1945/12/18         BSA:          1.592 m Patient Age:    71 years          BP:           108/75 mmHg Patient Gender: F                 HR:           80 bpm. Exam Location:  [facility] Procedure: 2D Echo, Color Doppler and Cardiac Doppler Indications:     R55 Syncope  History:         Patient has no prior history of Echocardiogram examinations.                  Signs/Symptoms:2 syncopal episodes. Hx of cervical cancer.  Sonographer:     Charmayne Sheer RDCS (AE) Referring Phys:  [id number] [facility] Diagnosing Phys: Serafina Royals MD  Sonographer Comments: Suboptimal parasternal window and suboptimal subcostal window. IMPRESSIONS  1. Left ventricular ejection fraction, by estimation, is 55 to 60%. The  left ventricle has normal function. The left ventricle has no regional wall motion abnormalities. Left ventricular diastolic parameters were normal.  2. Right ventricular systolic function is normal. The right ventricular size is normal.  3. The mitral valve is normal in structure. Trivial mitral valve regurgitation.  4. The aortic valve is normal in structure. Aortic valve regurgitation is trivial. FINDINGS  Left Ventricle: Left ventricular ejection fraction, by estimation, is 55 to 60%. The left ventricle has normal function. The left ventricle has no regional wall motion abnormalities. The left ventricular internal cavity size was normal in size. There is  no left ventricular hypertrophy. Left ventricular diastolic parameters were normal. Right Ventricle: The right ventricular size is normal. No increase in right ventricular wall thickness. Right ventricular systolic function is normal. Left Atrium: Left atrial size was normal in size.  Right Atrium: Right atrial size was normal in size. Pericardium: There is no evidence of pericardial effusion. Mitral Valve: The mitral valve is normal in structure. Trivial mitral valve regurgitation. MV peak gradient, 2.9 mmHg. The mean mitral valve gradient is 1.0 mmHg. Tricuspid Valve: The tricuspid valve is normal in structure. Tricuspid valve regurgitation is mild. Aortic Valve: The aortic valve is normal in structure. Aortic valve regurgitation is trivial. Aortic valve mean gradient measures 4.0 mmHg. Aortic valve peak gradient measures 7.2 mmHg. Aortic valve area, by VTI measures 2.18 cm. Pulmonic Valve: The pulmonic valve was normal in structure. Pulmonic valve regurgitation is not visualized. Aorta: The aortic root and ascending aorta are structurally normal, with no evidence of dilitation. IAS/Shunts: No atrial level shunt detected by color flow Doppler.  LEFT VENTRICLE PLAX 2D LVIDd:         3.30 cm  Diastology LVIDs:         2.40 cm  LV e' medial:    6.85 cm/s LV PW:          0.70 cm  LV E/e' medial:  8.7 LV IVS:        0.60 cm  LV e' lateral:   10.70 cm/s LVOT diam:     2.10 cm  LV E/e' lateral: 5.6 LV SV:         53 LV SV Index:   34 LVOT Area:     3.46 cm  RIGHT VENTRICLE RV Basal diam:  3.10 cm LEFT ATRIUM             Index       RIGHT ATRIUM           Index LA diam:        2.70 cm 1.70 cm/m  RA Area:     13.70 cm LA Vol (A2C):   29.5 ml 18.53 ml/m RA Volume:   28.30 ml  17.78 ml/m LA Vol (A4C):   21.7 ml 13.63 ml/m LA Biplane Vol: 27.0 ml 16.96 ml/m  AORTIC VALVE                   PULMONIC VALVE AV Area (Vmax):    2.66 cm    PV Vmax:       0.78 m/s AV Area (Vmean):   2.33 cm    PV Vmean:      53.700 cm/s AV Area (VTI):     2.18 cm    PV VTI:        0.177 m AV Vmax:           134.00 cm/s PV Peak grad:  2.4 mmHg AV Vmean:          94.000 cm/s PV Mean grad:  1.0 mmHg AV VTI:            0.245 m AV Peak Grad:      7.2 mmHg AV Mean Grad:      4.0 mmHg LVOT Vmax:         103.00 cm/s LVOT Vmean:        63.100 cm/s LVOT VTI:          0.154 m LVOT/AV VTI ratio: 0.63  AORTA Ao Root diam: 3.60 cm MITRAL VALVE MV Area (PHT): 3.99 cm    SHUNTS MV Area VTI:   2.90 cm    Systemic VTI:  0.15 m MV Peak grad:  2.9 mmHg    Systemic Diam: 2.10 cm MV Mean grad:  1.0 mmHg MV Vmax:  0.85 m/s MV Vmean:      50.2 cm/s MV Decel Time: 190 msec MV E velocity: 59.60 cm/s MV A velocity: 62.10 cm/s MV E/A ratio:  0.96 Serafina Royals MD Electronically signed by Serafina Royals MD Signature Date/Time: 08/14/2020/5:24:27 PM    Final      Scheduled Meds: . cholecalciferol  1,000 Units Oral Daily  . docusate sodium  100 mg Oral BID  . enoxaparin (LOVENOX) injection  40 mg Subcutaneous Q24H  . multivitamin  1 tablet Oral Daily   Continuous Infusions:   LOS: 1 day     Enzo Bi, MD [facility] If 7PM-7AM, please contact night-coverage 08/15/2020, 4:20 PM

## 2020-08-15 NOTE — Procedures (Signed)
Routine EEG Report  Suzanne Hogan is a 75 y.o. female with a history of spells who is undergoing an EEG to evaluate for seizures.  Report: This EEG was acquired with electrodes placed according to the International 10-20 electrode system (including Fp1, Fp2, F3, F4, C3, C4, P3, P4, O1, O2, T3, T4, T5, T6, A1, A2, Fz, Cz, Pz). The following electrodes were missing or displaced: none.  The occipital dominant rhythm was 9 Hz. This activity is reactive to stimulation. Drowsiness was manifested by background fragmentation; deeper stages of sleep were not identified. There was no focal slowing. There are numerous medium-to-high voltage monophasic wave bursts in alpha range 10-11 Hz most prominent in the temporal regions consistent with Wicket spikes. There were no interictal epileptiform discharges. There were no electrographic seizures identified. There was no abnormal response to photic stimulation or hyperventilation.   Impression: This EEG was obtained while awake and drowsy and is normal. Wicket spikes are a normal variant and do not signal increased epileptogenicity.    Clinical Correlation: Normal EEGs, however, do not rule out epilepsy.  Su Monks, MD [facility] [phone]  If 7pm- 7am, please page neurology on call as listed in [place].

## 2020-08-15 NOTE — Progress Notes (Signed)
Pt returned from MRI °

## 2020-08-15 NOTE — Progress Notes (Signed)
eeg done °

## 2020-08-16 LAB — BASIC METABOLIC PANEL
Anion gap: 7 (ref 5–15)
BUN: 15 mg/dL (ref 8–23)
CO2: 27 mmol/L (ref 22–32)
Calcium: 9 mg/dL (ref 8.9–10.3)
Chloride: 103 mmol/L (ref 98–111)
Creatinine, Ser: 0.79 mg/dL (ref 0.44–1.00)
GFR, Estimated: >60 mL/min (ref >60)
Glucose, Bld: 105 mg/dL — ABNORMAL HIGH (ref 70–99)
Potassium: 4.6 mmol/L (ref 3.5–5.1)
Sodium: 137 mmol/L (ref 135–145)

## 2020-08-16 LAB — CBC
HCT: 40.6 % (ref 36.0–46.0)
Hemoglobin: 13.8 g/dL (ref 12.0–15.0)
MCH: 31.1 pg (ref 26.0–34.0)
MCHC: 34 g/dL (ref 30.0–36.0)
MCV: 91.4 fL (ref 80.0–100.0)
Platelets: 231 10*3/uL (ref 150–400)
RBC: 4.44 MIL/uL (ref 3.87–5.11)
RDW: 13.3 % (ref 11.5–15.5)
WBC: 12 10*3/uL — ABNORMAL HIGH (ref 4.0–10.5)
nRBC: 0 % (ref 0.0–0.2)

## 2020-08-16 LAB — MAGNESIUM: Magnesium: 2.1 mg/dL (ref 1.7–2.4)

## 2020-08-16 NOTE — Progress Notes (Signed)
Patient discharging home. Instructions given to patient, verbalized understanding. Husband to come transport patient home. IV removed.  

## 2020-08-16 NOTE — Plan of Care (Signed)

## 2020-08-16 NOTE — Discharge Instructions (Signed)
Clavicle Fracture  A clavicle fracture is a break in the long bone that connects your shoulder to your chest wall (clavicle). The clavicle is also called the collarbone. This is a common injury that can happen at any age. What are the causes? Common causes of this condition include:  A hard, direct hit to the shoulder.  A motor vehicle accident.  A fall. What increases the risk? You are more likely to develop this condition if:  You are younger than 25 years of age or older than 77 years of age. Most clavicle fractures happen to people who are younger than 77 years of age.  You are female.  You play contact sports. What are the signs or symptoms? Symptoms of this condition include:  Pain near the injured shoulder and in the arm.  Trouble moving the arm on your injured side.  A shoulder that drops downward and forward.  Pain when you try to lift the shoulder.  Bruising, swelling, and tenderness over your shoulder.  A grinding noise when you try to move the shoulder.  A bump over your injured shoulder. How is this treated? Treatment for this condition depends on the injury.  If the broken ends of the bone are not out of place, your doctor may put your arm in a sling.  If the broken ends of the bone are out of place, you may need surgery to put the bones back together. You may be given medicines for pain. You may need to do physical therapy exercises to help your shoulder move better and get stronger after your injury has healed. Follow these instructions at home: Medicines  Take over-the-counter and prescription medicines only as told by your doctor.  Ask your doctor if the medicine prescribed to you: ? Requires you to avoid driving or using machinery. ? Can cause trouble pooping (constipation). You may need to take these actions to prevent or treat trouble pooping:  Drink enough fluid to keep your pee (urine) pale yellow.  Take over-the-counter or prescription  medicines.  Eat foods that are high in fiber. These include beans, whole grains, and fresh fruits and vegetables.  Limit foods that are high in fat and sugars. These include fried or sweet foods. If you have a sling:  Wear the sling as told by your doctor. Remove it only as told by your doctor.  Loosen it if your fingers: ? Tingle. ? Become numb. ? Turn cold and blue.  Do not lift your arm. Keep it across your chest.  Keep the sling clean.  Ask your doctor if you may remove the sling when you take a bath or shower. ? If not, and the sling is not waterproof, do not let it get wet. Cover it with a watertight covering when you take a bath or shower. ? If you may remove it when you take a bath or shower, keep your shoulder in the same position as when the sling is on. Managing pain, stiffness, and swelling  If told, put ice on the injured area. To do this: ? If you have a removable sling, remove it as told by your doctor. ? Put ice in a plastic bag. ? Place a towel between your skin and the bag. ? Leave the ice on for 20 minutes, 2-3 times a day.  Move your fingers often.  Raise (elevate) the injured area above the level of your heart while you are sitting or lying down.   Activity  Avoid activities that   make your symptoms worse for 4-6 weeks, or as long as told.  Do not lift anything that is heavier than 10 lb (4.5 kg), or the limit that you are told, until your doctor says that it is safe.  Do not put weight through your arm on the injured side until your doctor says it is safe. Do not pull or push things or try to support your body weight with that arm.  Ask your doctor when it is safe for you to drive.  Do exercises as told by your doctor.  Return to your normal activities as told by your doctor. Ask your doctor what activities are safe for you.   General instructions  Do not use any products that contain nicotine or tobacco, such as cigarettes, e-cigarettes, and  chewing tobacco. These can delay bone healing. If you need help quitting, ask your doctor.  Keep all follow-up visits as told by your doctor. This is important. Contact a doctor if:  Your medicine is not making you feel less pain.  Your medicine is not making swelling better. Get help right away if:  Your arm is numb. This means that you cannot feel it.  Your arm is cold.  Your arm is pale. Summary  A clavicle fracture is a break in the long bone that connects your shoulder to your chest wall.  Treatment depends on whether the broken ends of the bone are out of place or not.  If you have a sling, wear it as told by your doctor.  Do exercises when your doctor says you can. The exercises will help your shoulder move better and get stronger. This information is not intended to replace advice given to you by your health care provider. Make sure you discuss any questions you have with your health care provider. Document Revised: 05/10/2019 Document Reviewed: 05/10/2019 [employer] Patient Education  2021 [employer]  

## 2020-08-16 NOTE — Discharge Summary (Signed)
Physician Discharge Summary   TALIESHA ACOSTA  female DOB: September 30, 1945  [id number]  PCP: Tracie Harrier, MD  Admit date: 08/13/2020 Discharge date: 08/16/2020  Admitted From: home Disposition:  home CODE STATUS: Full code  Discharge Instructions    Discharge instructions   Complete by: As directed    You have had complete cardiac and neurological workup and still a reason for your fainting has not been found.    We recommend that you obtain a longer cardiac monitoring period (at least 14 days) with your primary care doctor.  Also follow up with neurology as outpatient.   Dr. Enzo Bi [facility] Course:  For full details, please see H&P, progress notes, consult notes and ancillary notes.  Briefly,  Suzanne Hogan a 75 y.o.femalewith past medical history of CLL, cervical cancer status post hysterectomy who presented to hospital after sustaining a syncopal episode while at dinner at a restaurant. She fell off from a chair onto the concrete and lost consciousness. Patient stated that she hit her right shoulder and complained of the right shoulder pain. Patient did have a similar syncopal episode on Easter Sunday when her primary care physician recommended Holter monitor which she wore only for 2 days.    Syncope --twice in recent months, with resulting injuries from the falls.  Pt now reported both times she fainted without warning, but this recent episode, pt said her husband told her she was present in a 10-minute conversation of which pt had no recollection. --Echo wnl, tele unremarkable during hospitalization.  Orthostatic BP measurement neg for orthostasis.  Neuro consulted, had complete neuro workup with MRI brain, MRA head and neck, EEG, all wnl.  Pt was discharged with syncope precaution and advised not to drive for 6 months.  Pt also advised to obtain longer cardiac monitoring (14 days) from PCP.  Will follow up with neuro as  outpatient.  Fall with right displaced distal clavicular fracture.  --received sling in the ED --ortho consulted, rec -PT/OT: Nonweightbearing right upper extremity with use of a sling for 4 to 6 weeks. Patient may weight-bear utilizing a platform walker through the right arm as tolerated.  --start PT for shoulder range of motion approximately 4 weeks after injury. -Recommend follow-up in ortho clinic (Dr. Sharlet Salina) in approximately 2 weeks with repeat x-rays  History of CLL.  Stable at this time.  History of cervical cancer. Status post hysterectomy.   Discharge Diagnoses:  Active Problems:   Syncope   Syncope and collapse   30 Day Unplanned Readmission Risk Score   Flowsheet Row ED to Hosp-Admission (Current) from 08/13/2020 in [facility] (1A)  30 Day Unplanned Readmission Risk Score (%) 11.32 Filed at 08/16/2020 0800     This score is the patient's risk of an unplanned readmission within 30 days of being discharged (0 -100%). The score is based on dignosis, age, lab data, medications, orders, and past utilization.   Low:  0-14.9   Medium: 15-21.9   High: 22-29.9   Extreme: 30 and above        Discharge Instructions:  Allergies as of 08/16/2020      Reactions   Codeine Diarrhea, Nausea And Vomiting   Sulfa Antibiotics Rash      Medication List    TAKE these medications   b complex vitamins tablet Take 1 tablet by mouth daily.   CALCIUM + D3 PO Take 1 Dose by mouth 2 (two)  times daily. 900 mg AND VITAMIN D 1200. 1 IN AMD AN 1/2 IN PM   cholecalciferol 1000 units tablet Commonly known as: VITAMIN D Take 1,000 Units by mouth daily.   multivitamin with minerals tablet Take 1 tablet by mouth daily.   Omega 3 1200 MG Caps Take 1 capsule by mouth.   vitamin C 500 MG tablet Commonly known as: ASCORBIC ACID Take 500 mg by mouth daily.   Zinc 25 MG Tabs Take 1 tablet by mouth.        Follow-up Information    Tracie Harrier, MD. Schedule an appointment as soon as possible for a visit in 1 week(s).   Specialty: Internal Medicine Contact information: [address] [place] [address] [phone]        Derek Jack, MD. Schedule an appointment as soon as possible for a visit in 1 month(s).   Specialty: Internal Medicine Why: This is the Neurologist who saw you in the hospital. Contact information: [place] [address] [phone]               Allergies  Allergen Reactions  . Codeine Diarrhea and Nausea And Vomiting  . Sulfa Antibiotics Rash     The results of significant diagnostics from this hospitalization (including imaging, microbiology, ancillary and laboratory) are listed below for reference.   Consultations:   Procedures/Studies: DG Shoulder Right  Result Date: 08/13/2020 CLINICAL DATA:  Fall, syncopal episode.  Right shoulder pain. EXAM: RIGHT SHOULDER - 2+ VIEW COMPARISON:  None. FINDINGS: Displaced distal clavicle fracture with 1 shaft width superior displacement of distal fracture fragment. There is mild offset of the acromioclavicular joint. No coracoclavicular joint space widening. No additional fracture. Glenohumeral joint is congruent. No fracture of the included right ribs. IMPRESSION: Displaced distal clavicle fracture with 1 shaft width superior displacement of distal fracture fragment. Mild offset of the acromioclavicular joint. Electronically Signed   By: Keith Rake M.D.   On: 08/13/2020 20:51   CT Head Wo Contrast  Result Date: 08/13/2020 CLINICAL DATA:  Syncopal episode bruising and laceration to right ear EXAM: CT HEAD WITHOUT CONTRAST CT CERVICAL SPINE WITHOUT CONTRAST TECHNIQUE: Multidetector CT imaging of the head and cervical spine was performed following the standard protocol without intravenous contrast. Multiplanar CT image reconstructions of the cervical spine were also generated.  COMPARISON:  None. FINDINGS: CT HEAD FINDINGS Brain: No acute territorial infarction, hemorrhage, or intracranial mass. Mild atrophy. Nonenlarged ventricles. Vascular: No hyperdense vessels.  Carotid vascular calcification Skull: Normal. Negative for fracture or focal lesion. Sinuses/Orbits: No acute finding. Other: None CT CERVICAL SPINE FINDINGS Alignment: Reversal of cervical lordosis. 4 mm anterolisthesis C4 on C5 and 6 mm anterolisthesis C7 on T1. Facet alignment is maintained Skull base and vertebrae: No acute fracture. No primary bone lesion or focal pathologic process. Soft tissues and spinal canal: No prevertebral fluid or swelling. No visible canal hematoma. Disc levels: Moderate severe diffuse degenerative change C3 through T1 with advanced degenerative changes also at T1-T2. Hypertrophic facet degenerative changes at multiple levels. Upper chest: Right apical scarring Other: None IMPRESSION: 1. No CT evidence for acute intracranial abnormality. 2. Reversal of cervical lordosis with degenerative changes. No fracture seen. Anterolisthesis C4 on C5 and C7 on T1 probably due to degenerative changes Electronically Signed   By: Donavan Foil M.D.   On: 08/13/2020 21:02   CT Cervical Spine Wo Contrast  Result Date: 08/13/2020 CLINICAL DATA:  Syncopal episode  bruising and laceration to right ear EXAM: CT HEAD WITHOUT CONTRAST CT CERVICAL SPINE WITHOUT CONTRAST TECHNIQUE: Multidetector CT imaging of the head and cervical spine was performed following the standard protocol without intravenous contrast. Multiplanar CT image reconstructions of the cervical spine were also generated. COMPARISON:  None. FINDINGS: CT HEAD FINDINGS Brain: No acute territorial infarction, hemorrhage, or intracranial mass. Mild atrophy. Nonenlarged ventricles. Vascular: No hyperdense vessels.  Carotid vascular calcification Skull: Normal. Negative for fracture or focal lesion. Sinuses/Orbits: No acute finding. Other: None CT CERVICAL  SPINE FINDINGS Alignment: Reversal of cervical lordosis. 4 mm anterolisthesis C4 on C5 and 6 mm anterolisthesis C7 on T1. Facet alignment is maintained Skull base and vertebrae: No acute fracture. No primary bone lesion or focal pathologic process. Soft tissues and spinal canal: No prevertebral fluid or swelling. No visible canal hematoma. Disc levels: Moderate severe diffuse degenerative change C3 through T1 with advanced degenerative changes also at T1-T2. Hypertrophic facet degenerative changes at multiple levels. Upper chest: Right apical scarring Other: None IMPRESSION: 1. No CT evidence for acute intracranial abnormality. 2. Reversal of cervical lordosis with degenerative changes. No fracture seen. Anterolisthesis C4 on C5 and C7 on T1 probably due to degenerative changes Electronically Signed   By: Jasmine PangKim  Fujinaga M.D.   On: 08/13/2020 21:02   MR ANGIO HEAD WO CONTRAST  Result Date: 08/14/2020 CLINICAL DATA:  Initial evaluation for neuro deficit, stroke suspected. EXAM: MRA HEAD WITHOUT CONTRAST MRA NECK WITHOUT AND WITH CONTRAST TECHNIQUE: Angiographic images of the Circle of Willis were acquired using MRA technique without intravenous contrast. Angiographic images of the neck were acquired using MRA technique without and with intravenous contrast. Carotid stenosis measurements (when applicable) are obtained utilizing NASCET criteria, using the distal internal carotid diameter as the denominator. CONTRAST:  5mL GADAVIST GADOBUTROL 1 MMOL/ML IV SOLN COMPARISON:  Prior CT from 08/13/2020. FINDINGS: MRA HEAD FINDINGS ANTERIOR CIRCULATION: Examination mildly degraded by motion artifact. Visualized distal cervical segments of the internal carotid arteries are patent with antegrade flow. Petrous, cavernous, and supraclinoid segments patent without stenosis. 3 mm focal outpouching extending laterally and slightly posteriorly from the cavernous segment of the right ICA suspicious for a small aneurysm (series 5,  image 98). Left A1 segment widely patent. Right A1 hypoplastic and/or absent, with the anterior cerebral arteries primarily supplied via the left carotid artery system. Grossly normal anterior communicating artery complex. Anterior cerebral arteries patent to their distal aspects without stenosis. No M1 stenosis or occlusion. Normal MCA bifurcations. Distal MCA branches well perfused and symmetric. POSTERIOR CIRCULATION: Both V4 segments patent to the vertebrobasilar junction without stenosis. Right vertebral artery dominant. Both PICA origins patent and normal. Basilar patent to its distal aspect without stenosis. Superior cerebral arteries patent bilaterally. Left PCA supplied via the basilar as well as a small left posterior communicating artery. Fetal type origin of the right PCA. Both PCAs well perfused to their distal aspects without stenosis. MRA NECK FINDINGS AORTIC ARCH: Visualized aortic arch normal in caliber with normal 3 vessel morphology. No hemodynamically significant stenosis seen about the origin of the great vessels. Visualized subclavian arteries widely patent. RIGHT CAROTID SYSTEM: Right common and internal carotid arteries widely patent without stenosis, evidence for dissection or occlusion. LEFT CAROTID SYSTEM: Left common and internal carotid arteries widely patent without stenosis, evidence for dissection or occlusion. VERTEBRAL ARTERIES: Both vertebral arteries arise from subclavian arteries. Right vertebral artery slightly dominant. No proximal subclavian artery stenosis. Vertebral arteries widely patent without stenosis, evidence for dissection or occlusion.  IMPRESSION: 1. Negative MRA of the head and neck. No large vessel occlusion or hemodynamically significant stenosis. 2. 3 mm focal outpouching arising from the cavernous segment of the right ICA, suspicious for a small aneurysm. 3. 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 Findings consistent with normal  anatomic variants. Electronically Signed   By: Jeannine Boga M.D.   On: 08/14/2020 22:43   MR ANGIO NECK W WO CONTRAST  Result Date: 08/14/2020 CLINICAL DATA:  Initial evaluation for neuro deficit, stroke suspected. EXAM: MRA HEAD WITHOUT CONTRAST MRA NECK WITHOUT AND WITH CONTRAST TECHNIQUE: Angiographic images of the Circle of Willis were acquired using MRA technique without intravenous contrast. Angiographic images of the neck were acquired using MRA technique without and with intravenous contrast. Carotid stenosis measurements (when applicable) are obtained utilizing NASCET criteria, using the distal internal carotid diameter as the denominator. CONTRAST:  23mL GADAVIST GADOBUTROL 1 MMOL/ML IV SOLN COMPARISON:  Prior CT from 08/13/2020. FINDINGS: MRA HEAD FINDINGS ANTERIOR CIRCULATION: Examination mildly degraded by motion artifact. Visualized distal cervical segments of the internal carotid arteries are patent with antegrade flow. Petrous, cavernous, and supraclinoid segments patent without stenosis. 3 mm focal outpouching extending laterally and slightly posteriorly from the cavernous segment of the right ICA suspicious for a small aneurysm (series 5, image 98). Left A1 segment widely patent. Right A1 hypoplastic and/or absent, with the anterior cerebral arteries primarily supplied via the left carotid artery system. Grossly normal anterior communicating artery complex. Anterior cerebral arteries patent to their distal aspects without stenosis. No M1 stenosis or occlusion. Normal MCA bifurcations. Distal MCA branches well perfused and symmetric. POSTERIOR CIRCULATION: Both V4 segments patent to the vertebrobasilar junction without stenosis. Right vertebral artery dominant. Both PICA origins patent and normal. Basilar patent to its distal aspect without stenosis. Superior cerebral arteries patent bilaterally. Left PCA supplied via the basilar as well as a small left posterior communicating artery.  Fetal type origin of the right PCA. Both PCAs well perfused to their distal aspects without stenosis. MRA NECK FINDINGS AORTIC ARCH: Visualized aortic arch normal in caliber with normal 3 vessel morphology. No hemodynamically significant stenosis seen about the origin of the great vessels. Visualized subclavian arteries widely patent. RIGHT CAROTID SYSTEM: Right common and internal carotid arteries widely patent without stenosis, evidence for dissection or occlusion. LEFT CAROTID SYSTEM: Left common and internal carotid arteries widely patent without stenosis, evidence for dissection or occlusion. VERTEBRAL ARTERIES: Both vertebral arteries arise from subclavian arteries. Right vertebral artery slightly dominant. No proximal subclavian artery stenosis. Vertebral arteries widely patent without stenosis, evidence for dissection or occlusion. IMPRESSION: 1. Negative MRA of the head and neck. No large vessel occlusion or hemodynamically significant stenosis. 2. 3 mm focal outpouching arising from the cavernous segment of the right ICA, suspicious for a small aneurysm. 3. 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 Findings consistent with normal anatomic variants. Electronically Signed   By: Jeannine Boga M.D.   On: 08/14/2020 22:43   MR BRAIN W WO CONTRAST  Result Date: 08/16/2020 CLINICAL DATA:  Initial evaluation for acute TIA. Post multiple sclerosis. EXAM: MRI HEAD WITHOUT AND WITH CONTRAST TECHNIQUE: Multiplanar, multiecho pulse sequences of the brain and surrounding structures were obtained without and with intravenous contrast. CONTRAST:  7.33mL GADAVIST GADOBUTROL 1 MMOL/ML IV SOLN COMPARISON:  Prior head CT from 08/13/2020. FINDINGS: Brain: Cerebral volume within normal limits. Scattered patchy and confluent T2/FLAIR hyperintensity seen about the periventricular white matter, nonspecific, but  overall mild for age. No abnormal foci of restricted diffusion to suggest acute or  subacute ischemia. Gray-white matter differentiation maintained. No encephalomalacia to suggest chronic cortical infarction. No evidence for acute or chronic intracranial hemorrhage. No mass lesion, midline shift or mass effect. No hydrocephalus or extra-axial fluid collection. Pituitary gland and suprasellar region normal. Midline structures intact and normal. No abnormal enhancement. Incidental note made of a left cerebellar DVA. Vascular: Major intracranial vascular flow voids are maintained. Skull and upper cervical spine: Craniocervical junction within normal limits. Degenerative spondylosis noted at C3-4 with resultant mild spinal stenosis. Bone marrow signal intensity within normal limits. No scalp soft tissue abnormality. Sinuses/Orbits: Globes and orbital soft tissues demonstrate no acute finding. Paranasal sinuses are largely clear. No mastoid effusion. Inner ear structures grossly normal. Other: None. IMPRESSION: 1. No acute intracranial infarct or other abnormality. 2. Mild nonspecific cerebral white matter disease involving the supratentorial periventricular white matter. No abnormal enhancement. Electronically Signed   By: Jeannine Boga M.D.   On: 08/16/2020 00:33   EEG adult  Result Date: 08/15/2020 Derek Jack, MD     08/15/2020  8:12 PM Routine EEG Report Suzanne Hogan is a 75 y.o. female with a history of spells who is undergoing an EEG to evaluate for seizures. Report: This EEG was acquired with electrodes placed according to the International 10-20 electrode system (including Fp1, Fp2, F3, F4, C3, C4, P3, P4, O1, O2, T3, T4, T5, T6, A1, A2, Fz, Cz, Pz). The following electrodes were missing or displaced: none. The occipital dominant rhythm was 9 Hz. This activity is reactive to stimulation. Drowsiness was manifested by background fragmentation; deeper stages of sleep were not identified. There was no focal slowing. There are numerous medium-to-high voltage monophasic wave  bursts in alpha range 10-11 Hz most prominent in the temporal regions consistent with Wicket spikes. There were no interictal epileptiform discharges. There were no electrographic seizures identified. There was no abnormal response to photic stimulation or hyperventilation. Impression: This EEG was obtained while awake and drowsy and is normal. Wicket spikes are a normal variant and do not signal increased epileptogenicity.   Clinical Correlation: Normal EEGs, however, do not rule out epilepsy. Su Monks, MD [facility] [phone] If 7pm- 7am, please page neurology on call as listed in [place].   MM 3D SCREEN BREAST BILATERAL  Result Date: 08/13/2020 CLINICAL DATA:  Screening. EXAM: DIGITAL SCREENING BILATERAL MAMMOGRAM WITH TOMOSYNTHESIS AND CAD TECHNIQUE: Bilateral screening digital craniocaudal and mediolateral oblique mammograms were obtained. Bilateral screening digital breast tomosynthesis was performed. The images were evaluated with computer-aided detection. COMPARISON:  Previous exam(s). ACR Breast Density Category b: There are scattered areas of fibroglandular density. FINDINGS: There are no findings suspicious for malignancy. The images were evaluated with computer-aided detection. IMPRESSION: No mammographic evidence of malignancy. A result letter of this screening mammogram will be mailed directly to the patient. RECOMMENDATION: Screening mammogram in one year. (Code:SM-B-01Y) BI-RADS CATEGORY  1: Negative. Electronically Signed   By: Lajean Manes M.D.   On: 08/13/2020 09:19   ECHOCARDIOGRAM COMPLETE  Result Date: 08/14/2020    ECHOCARDIOGRAM REPORT   Patient Name:   Suzanne Hogan Date of Exam: 08/14/2020 Medical Rec #:  [id number]         Height:       62.0 in Accession #:    [id number]        Weight:       130.0 lb Date of Birth:  [birth year]  BSA:          1.592 m Patient Age:    75 years          BP:           108/75 mmHg Patient Gender: F                 HR:            80 bpm. Exam Location:  [facility] Procedure: 2D Echo, Color Doppler and Cardiac Doppler Indications:     R55 Syncope  History:         Patient has no prior history of Echocardiogram examinations.                  Signs/Symptoms:2 syncopal episodes. Hx of cervical cancer.  Sonographer:     Charmayne Sheer RDCS (AE) Referring Phys:  [id number] [facility] Diagnosing Phys: Serafina Royals MD  Sonographer Comments: Suboptimal parasternal window and suboptimal subcostal window. IMPRESSIONS  1. Left ventricular ejection fraction, by estimation, is 55 to 60%. The left ventricle has normal function. The left ventricle has no regional wall motion abnormalities. Left ventricular diastolic parameters were normal.  2. Right ventricular systolic function is normal. The right ventricular size is normal.  3. The mitral valve is normal in structure. Trivial mitral valve regurgitation.  4. The aortic valve is normal in structure. Aortic valve regurgitation is trivial. FINDINGS  Left Ventricle: Left ventricular ejection fraction, by estimation, is 55 to 60%. The left ventricle has normal function. The left ventricle has no regional wall motion abnormalities. The left ventricular internal cavity size was normal in size. There is  no left ventricular hypertrophy. Left ventricular diastolic parameters were normal. Right Ventricle: The right ventricular size is normal. No increase in right ventricular wall thickness. Right ventricular systolic function is normal. Left Atrium: Left atrial size was normal in size. Right Atrium: Right atrial size was normal in size. Pericardium: There is no evidence of pericardial effusion. Mitral Valve: The mitral valve is normal in structure. Trivial mitral valve regurgitation. MV peak gradient, 2.9 mmHg. The mean mitral valve gradient is 1.0 mmHg. Tricuspid Valve: The tricuspid valve is normal in structure. Tricuspid valve regurgitation is mild. Aortic Valve: The aortic valve is normal in structure. Aortic valve  regurgitation is trivial. Aortic valve mean gradient measures 4.0 mmHg. Aortic valve peak gradient measures 7.2 mmHg. Aortic valve area, by VTI measures 2.18 cm. Pulmonic Valve: The pulmonic valve was normal in structure. Pulmonic valve regurgitation is not visualized. Aorta: The aortic root and ascending aorta are structurally normal, with no evidence of dilitation. IAS/Shunts: No atrial level shunt detected by color flow Doppler.  LEFT VENTRICLE PLAX 2D LVIDd:         3.30 cm  Diastology LVIDs:         2.40 cm  LV e' medial:    6.85 cm/s LV PW:         0.70 cm  LV E/e' medial:  8.7 LV IVS:        0.60 cm  LV e' lateral:   10.70 cm/s LVOT diam:     2.10 cm  LV E/e' lateral: 5.6 LV SV:         53 LV SV Index:   34 LVOT Area:     3.46 cm  RIGHT VENTRICLE RV Basal diam:  3.10 cm LEFT ATRIUM             Index  RIGHT ATRIUM           Index LA diam:        2.70 cm 1.70 cm/m  RA Area:     13.70 cm LA Vol (A2C):   29.5 ml 18.53 ml/m RA Volume:   28.30 ml  17.78 ml/m LA Vol (A4C):   21.7 ml 13.63 ml/m LA Biplane Vol: 27.0 ml 16.96 ml/m  AORTIC VALVE                   PULMONIC VALVE AV Area (Vmax):    2.66 cm    PV Vmax:       0.78 m/s AV Area (Vmean):   2.33 cm    PV Vmean:      53.700 cm/s AV Area (VTI):     2.18 cm    PV VTI:        0.177 m AV Vmax:           134.00 cm/s PV Peak grad:  2.4 mmHg AV Vmean:          94.000 cm/s PV Mean grad:  1.0 mmHg AV VTI:            0.245 m AV Peak Grad:      7.2 mmHg AV Mean Grad:      4.0 mmHg LVOT Vmax:         103.00 cm/s LVOT Vmean:        63.100 cm/s LVOT VTI:          0.154 m LVOT/AV VTI ratio: 0.63  AORTA Ao Root diam: 3.60 cm MITRAL VALVE MV Area (PHT): 3.99 cm    SHUNTS MV Area VTI:   2.90 cm    Systemic VTI:  0.15 m MV Peak grad:  2.9 mmHg    Systemic Diam: 2.10 cm MV Mean grad:  1.0 mmHg MV Vmax:       0.85 m/s MV Vmean:      50.2 cm/s MV Decel Time: 190 msec MV E velocity: 59.60 cm/s MV A velocity: 62.10 cm/s MV E/A ratio:  0.96 Serafina Royals MD  Electronically signed by Serafina Royals MD Signature Date/Time: 08/14/2020/5:24:27 PM    Final       Labs: BNP (last 3 results) No results for input(s): BNP in the last 8760 hours. Basic Metabolic Panel: Recent Labs  Lab 08/13/20 2050 08/14/20 0530 08/15/20 0555 08/16/20 0527  NA 135 138 139 137  K 3.7 3.7 3.9 4.6  CL 101 105 106 103  CO2 24 24 26 27   GLUCOSE 116* 97 95 105*  BUN 15 12 14 15   CREATININE 0.85 0.68 0.80 0.79  CALCIUM 8.8* 8.4* 8.7* 9.0  MG  --  2.2 2.3 2.1  PHOS  --  4.3  --   --    Liver Function Tests: No results for input(s): AST, ALT, ALKPHOS, BILITOT, PROT, ALBUMIN in the last 168 hours. No results for input(s): LIPASE, AMYLASE in the last 168 hours. No results for input(s): AMMONIA in the last 168 hours. CBC: Recent Labs  Lab 08/13/20 2050 08/14/20 0530 08/15/20 0555 08/16/20 0527  WBC 10.9* 10.4 11.1* 12.0*  NEUTROABS 4.9  --   --   --   HGB 13.7 12.8 14.0 13.8  HCT 40.6 37.8 41.9 40.6  MCV 91.2 91.1 91.1 91.4  PLT 253 232 251 231   Cardiac Enzymes: No results for input(s): CKTOTAL, CKMB, CKMBINDEX, TROPONINI in the last 168 hours. BNP: Invalid input(s): POCBNP CBG:  No results for input(s): GLUCAP in the last 168 hours. D-Dimer No results for input(s): DDIMER in the last 72 hours. Hgb A1c No results for input(s): HGBA1C in the last 72 hours. Lipid Profile No results for input(s): CHOL, HDL, LDLCALC, TRIG, CHOLHDL, LDLDIRECT in the last 72 hours. Thyroid function studies No results for input(s): TSH, T4TOTAL, T3FREE, THYROIDAB in the last 72 hours.  Invalid input(s): FREET3 Anemia work up No results for input(s): VITAMINB12, FOLATE, FERRITIN, TIBC, IRON, RETICCTPCT in the last 72 hours. Urinalysis No results found for: COLORURINE, APPEARANCEUR, [place], [place], [place], [place], [place], [place], PROTEINUR, UROBILINOGEN, NITRITE, LEUKOCYTESUR Sepsis Labs Invalid input(s): PROCALCITONIN,  WBC,  LACTICIDVEN Microbiology Recent  Results (from the past 240 hour(s))  SARS CORONAVIRUS 2 (TAT 6-24 HRS) Nasopharyngeal Nasopharyngeal Swab     Status: None   Collection Time: 08/13/20 10:06 PM   Specimen: Nasopharyngeal Swab  Result Value Ref Range Status   SARS Coronavirus 2 NEGATIVE NEGATIVE Final    Comment: (NOTE) SARS-CoV-2 target nucleic acids are NOT DETECTED.  The SARS-CoV-2 RNA is generally detectable in upper and lower respiratory specimens during the acute phase of infection. Negative results do not preclude SARS-CoV-2 infection, do not rule out co-infections with other pathogens, and should not be used as the sole basis for treatment or other patient management decisions. Negative results must be combined with clinical observations, patient history, and epidemiological information. The expected result is Negative.  Fact Sheet for Patients: SugarRoll.be  Fact Sheet for Healthcare Providers: https://www.woods-mathews.com/  This test is not yet approved or cleared by the [place] FDA and  has been authorized for detection and/or diagnosis of SARS-CoV-2 by FDA under an Emergency Use Authorization (EUA). This EUA will remain  in effect (meaning this test can be used) for the duration of the COVID-19 declaration under Se ction 564(b)(1) of the Act, 21 U.S.C. section 360bbb-3(b)(1), unless the authorization is terminated or revoked sooner.  Performed at [facility], [place] [address], [place], [place] [address]      Total time spend on discharging this patient, including the last patient exam, discussing the hospital stay, instructions for ongoing care as it relates to all pertinent caregivers, as well as preparing the medical discharge records, prescriptions, and/or referrals as applicable, is 35 minutes.    Enzo Bi, MD  [facility] 08/16/2020, 9:25 AM

## 2020-11-20 DIAGNOSIS — I671 Cerebral aneurysm, nonruptured: Secondary | ICD-10-CM

## 2020-11-20 HISTORY — DX: Cerebral aneurysm, nonruptured: I67.1

## 2020-11-21 ENCOUNTER — Other Ambulatory Visit: Payer: Self-pay | Admitting: Neurosurgery

## 2020-11-21 DIAGNOSIS — I671 Cerebral aneurysm, nonruptured: Secondary | ICD-10-CM

## 2021-02-16 ENCOUNTER — Ambulatory Visit
Admission: RE | Admit: 2021-02-16 | Discharge: 2021-02-16 | Disposition: A | Discharge status: Home / Self Care | Payer: Medicare Other | Source: Ambulatory Visit | Attending: Neurosurgery | Admitting: Neurosurgery

## 2021-02-16 ENCOUNTER — Other Ambulatory Visit: Payer: Self-pay

## 2021-02-16 DIAGNOSIS — I671 Cerebral aneurysm, nonruptured: Secondary | ICD-10-CM | POA: Diagnosis present

## 2021-10-15 ENCOUNTER — Encounter: Payer: Self-pay | Admitting: Ophthalmology

## 2021-10-21 NOTE — Discharge Instructions (Signed)

## 2021-10-23 ENCOUNTER — Ambulatory Visit
Admission: RE | Admit: 2021-10-23 | Discharge: 2021-10-23 | Disposition: A | Discharge status: Home / Self Care | Payer: Medicare Other | Source: Ambulatory Visit | Attending: Ophthalmology | Admitting: Ophthalmology

## 2021-10-23 ENCOUNTER — Encounter: Payer: Self-pay | Admitting: Ophthalmology

## 2021-10-23 ENCOUNTER — Other Ambulatory Visit: Payer: Self-pay

## 2021-10-23 ENCOUNTER — Ambulatory Visit: Payer: Medicare Other | Admitting: Anesthesiology

## 2021-10-23 ENCOUNTER — Encounter
Admission: RE | Disposition: A | Discharge status: Home / Self Care | Payer: Self-pay | Source: Ambulatory Visit | Attending: Ophthalmology

## 2021-10-23 DIAGNOSIS — H02831 Dermatochalasis of right upper eyelid: Secondary | ICD-10-CM | POA: Insufficient documentation

## 2021-10-23 DIAGNOSIS — R569 Unspecified convulsions: Secondary | ICD-10-CM | POA: Diagnosis not present

## 2021-10-23 DIAGNOSIS — H02834 Dermatochalasis of left upper eyelid: Secondary | ICD-10-CM | POA: Diagnosis not present

## 2021-10-23 DIAGNOSIS — H02839 Dermatochalasis of unspecified eye, unspecified eyelid: Secondary | ICD-10-CM | POA: Diagnosis present

## 2021-10-23 DIAGNOSIS — Z87891 Personal history of nicotine dependence: Secondary | ICD-10-CM | POA: Diagnosis not present

## 2021-10-23 HISTORY — DX: Presence of external hearing-aid: Z97.4

## 2021-10-23 HISTORY — PX: BROW LIFT: SHX178

## 2021-10-23 SURGERY — BLEPHAROPLASTY
Anesthesia: General | Site: Eye | Laterality: Bilateral

## 2021-10-23 MED ORDER — PROPOFOL 500 MG/50ML IV EMUL
INTRAVENOUS | Status: DC | PRN
  Administered 2021-10-23: 20 mg via INTRAVENOUS
  Administered 2021-10-23: 25 ug/kg/min via INTRAVENOUS

## 2021-10-23 MED ORDER — ERYTHROMYCIN 5 MG/GM OP OINT
TOPICAL_OINTMENT | OPHTHALMIC | Status: DC | PRN
  Administered 2021-10-23: 1 via OPHTHALMIC

## 2021-10-23 MED ORDER — MIDAZOLAM HCL 2 MG/2ML IJ SOLN
INTRAMUSCULAR | Status: DC | PRN
  Administered 2021-10-23: 2 mg via INTRAVENOUS

## 2021-10-23 MED ORDER — LACTATED RINGERS IV SOLN: INTRAVENOUS | Status: DC

## 2021-10-23 MED ORDER — ERYTHROMYCIN 5 MG/GM OP OINT: TOPICAL_OINTMENT | OPHTHALMIC | 2 refills | Status: AC

## 2021-10-23 MED ORDER — TRAMADOL HCL 50 MG PO TABS: ORAL_TABLET | ORAL | 0 refills | Status: AC

## 2021-10-23 MED ORDER — LIDOCAINE-EPINEPHRINE 2 %-1:100000 IJ SOLN
INTRAMUSCULAR | Status: DC | PRN
  Administered 2021-10-23: 2 mL via OPHTHALMIC

## 2021-10-23 MED ORDER — OXYCODONE HCL 5 MG PO TABS: 5.0000 mg | ORAL_TABLET | Freq: Once | ORAL | Status: DC | PRN

## 2021-10-23 MED ORDER — OXYCODONE HCL 5 MG/5ML PO SOLN: 5.0000 mg | Freq: Once | ORAL | Status: DC | PRN

## 2021-10-23 MED ORDER — LIDOCAINE HCL (CARDIAC) PF 100 MG/5ML IV SOSY
PREFILLED_SYRINGE | INTRAVENOUS | Status: DC | PRN
  Administered 2021-10-23: 20 mg via INTRAVENOUS

## 2021-10-23 MED ORDER — FENTANYL CITRATE (PF) 100 MCG/2ML IJ SOLN
INTRAMUSCULAR | Status: DC | PRN
  Administered 2021-10-23 (×2): 50 ug via INTRAVENOUS

## 2021-10-23 MED ORDER — FENTANYL CITRATE PF 50 MCG/ML IJ SOSY: 25.0000 ug | PREFILLED_SYRINGE | INTRAMUSCULAR | Status: DC | PRN

## 2021-10-23 MED ORDER — ONDANSETRON HCL 4 MG/2ML IJ SOLN
INTRAMUSCULAR | Status: DC | PRN
  Administered 2021-10-23: 4 mg via INTRAVENOUS

## 2021-10-23 SURGICAL SUPPLY — 22 items
APPLICATOR COTTON TIP WD 3 STR (MISCELLANEOUS) ×2 IMPLANT
BLADE SURG 15 STRL LF DISP TIS (BLADE) ×1 IMPLANT
BLADE SURG 15 STRL SS (BLADE) ×2
CORD BIP STRL DISP 12FT (MISCELLANEOUS) ×2 IMPLANT
GAUZE SPONGE 4X4 12PLY STRL (GAUZE/BANDAGES/DRESSINGS) ×2 IMPLANT
GLOVE SURG UNDER POLY LF SZ7 (GLOVE) ×4 IMPLANT
GOWN STRL REUS W/ TWL LRG LVL3 (GOWN DISPOSABLE) ×1 IMPLANT
GOWN STRL REUS W/TWL LRG LVL3 (GOWN DISPOSABLE) ×2
MARKER SKIN XFINE TIP W/RULER (MISCELLANEOUS) ×2 IMPLANT
NDL FILTER BLUNT 18X1 1/2 (NEEDLE) ×1 IMPLANT
NDL HYPO 30X.5 LL (NEEDLE) ×2 IMPLANT
NEEDLE FILTER BLUNT 18X 1/2SAF (NEEDLE) ×1
NEEDLE FILTER BLUNT 18X1 1/2 (NEEDLE) ×1 IMPLANT
NEEDLE HYPO 30X.5 LL (NEEDLE) ×4 IMPLANT
PACK ENT CUSTOM (PACKS) ×2 IMPLANT
SOL PREP PVP 2OZ (MISCELLANEOUS) ×2
SOLUTION PREP PVP 2OZ (MISCELLANEOUS) ×1 IMPLANT
SPONGE GAUZE 2X2 8PLY STRL LF (GAUZE/BANDAGES/DRESSINGS) ×20 IMPLANT
SUT GUT PLAIN 6-0 1X18 ABS (SUTURE) ×3 IMPLANT
SYR 10ML LL (SYRINGE) ×2 IMPLANT
SYR 3ML LL SCALE MARK (SYRINGE) ×2 IMPLANT
WATER STERILE IRR 250ML POUR (IV SOLUTION) ×2 IMPLANT

## 2021-10-23 NOTE — H&P (Signed)
[place]: [facility]  Primary Care Physician:  Tracie Harrier, MD Ophthalmologist: Dr. Philis Pique. Vickki Muff, M.D.  Pre-Procedure History & Physical: HPI:  Suzanne Hogan is a 75 y.o. female here for periocular surgery.   Past Medical History:  Diagnosis Date   Cerebral aneurysm 11/20/2020   2 mm   Cervical cancer ([place]) 1981   Lymphocytosis    Seizure ([place]) 08/13/2020   Wears hearing aid in both ears     Past Surgical History:  Procedure Laterality Date   ABDOMINAL HYSTERECTOMY  09/04/1979   AT [facility] LEFT OVARIES IN   APPENDECTOMY  1981   COLONOSCOPY     2003, 2013    Prior to Admission medications   Medication Sig Start Date End Date Taking? Authorizing Provider  b complex vitamins tablet Take 1 tablet by mouth daily.   Yes [provider]  Calcium Carb-Cholecalciferol (CALCIUM + D3 PO) Take 1 Dose by mouth 2 (two) times daily. 900 mg AND VITAMIN D 1200. 1 IN AMD AN 1/2 IN PM   Yes [provider]  cholecalciferol (VITAMIN D) 1000 UNITS tablet Take 1,000 Units by mouth daily.   Yes [provider]  Coenzyme Q10 (CO Q-10) 100 MG CAPS Take by mouth daily.   Yes [provider]  gabapentin (NEURONTIN) 100 MG capsule Take 200 mg by mouth 2 (two) times daily.   Yes [provider]  Multiple Vitamins-Minerals (MULTIVITAMIN WITH MINERALS) tablet Take 1 tablet by mouth daily.   Yes [provider]  Omega 3 1200 MG CAPS Take 1 capsule by mouth.   Yes [provider]  vitamin C (ASCORBIC ACID) 500 MG tablet Take 250 mg by mouth 2 (two) times daily.   Yes [provider]  Zinc 25 MG TABS Take 1 tablet by mouth.   Yes [provider]  levETIRAcetam (KEPPRA) 500 MG tablet Take 250 mg by mouth 2 (two) times daily. Patient not taking: Reported on 10/15/2021    [provider]    Allergies as of 06/19/2021 - Review Complete 08/13/2020  Allergen Reaction Noted   Codeine Diarrhea and Nausea  And Vomiting 02/17/2015   Sulfa antibiotics Rash 02/17/2015    Family History  Problem Relation Age of Onset   Breast cancer Neg Hx     Social History   Socioeconomic History   Marital status: Married    Spouse name: Not on file   Number of children: Not on file   Years of education: Not on file   Highest education level: Not on file  Occupational History   Not on file  Tobacco Use   Smoking status: Former    Years: 10.00    Types: Cigarettes    Quit date: 06/02/2002    Years since quitting: 19.4   Smokeless tobacco: Never  Vaping Use   Vaping Use: Never used  Substance and Sexual Activity   Alcohol use: Yes    Alcohol/week: 10.0 standard drinks of alcohol    Types: 10 Glasses of wine per week    Comment: 1 - 1.5 GLASS  7 TIMES A WEEK   Drug use: No   Sexual activity: Not on file  Other Topics Concern   Not on file  Social History Narrative   Not on file   Social Determinants of Health   Financial Resource Strain: Not on file  Food Insecurity: Not on file  Transportation Needs: Not on file  Physical Activity: Not on file  Stress:  Not on file  Social Connections: Not on file  Intimate Partner Violence: Not on file    Review of Systems: See HPI, otherwise negative ROS  Physical Exam: BP 124/80   Pulse 67   Temp 97.6 F (36.4 C) (Temporal)   Ht '5\' 2"'$  (1.575 m)   Wt 60.3 kg   SpO2 98%   BMI 24.33 kg/m  General:   Alert and cooperative in NAD Head:  Normocephalic and atraumatic. Respiratory:  Normal work of breathing.  Impression/Plan: Suzanne Hogan is here for periocular surgery.  Risks, benefits, limitations, and alternatives regarding surgery have been reviewed with the patient.  Questions have been answered.  All parties agreeable.   Karle Starch, MD  10/23/2021, 8:56 AM

## 2021-10-23 NOTE — Anesthesia Postprocedure Evaluation (Signed)
Anesthesia Post Note  Patient: Suzanne Hogan  Procedure(s) Performed: BLEPHAROPLASTY UPPER EYELID; E/EXCESS SKIN BILATERAL (Bilateral: Eye)     Patient location during evaluation: PACU Anesthesia Type: General Level of consciousness: awake and alert Pain management: pain level controlled Vital Signs Assessment: post-procedure vital signs reviewed and stable Respiratory status: spontaneous breathing, nonlabored ventilation, respiratory function stable and patient connected to nasal cannula oxygen Cardiovascular status: blood pressure returned to baseline and stable Postop Assessment: no apparent nausea or vomiting Anesthetic complications: no   No notable events documented.  Adele Barthel Samyiah Halvorsen

## 2021-10-23 NOTE — Interval H&P Note (Signed)
History and Physical Interval Note:  10/23/2021 8:56 AM  Suzanne Hogan  has presented today for surgery, with the diagnosis of H02.831 Dermatochalasis of Right Upper Eyelid H02.834 Dermatochalasis of Left Upper Eyelid.  The various methods of treatment have been discussed with the patient and family. After consideration of risks, benefits and other options for treatment, the patient has consented to  Procedure(s): BLEPHAROPLASTY UPPER EYELID; E/EXCESS SKIN BILATERAL (Bilateral) as a surgical intervention.  The patient's history has been reviewed, patient examined, no change in status, stable for surgery.  I have reviewed the patient's chart and labs.  Questions were answered to the patient's satisfaction.     Vickki Muff, Ayriel Texidor M

## 2021-10-23 NOTE — Anesthesia Preprocedure Evaluation (Signed)
Anesthesia Evaluation  Patient identified by MRN, date of birth, ID band Patient awake    History of Anesthesia Complications Negative for: history of anesthetic complications  Airway Mallampati: I  TM Distance: >3 FB Neck ROM: Full    Dental no notable dental hx.    Pulmonary former smoker,    Pulmonary exam normal        Cardiovascular Exercise Tolerance: Good negative cardio ROS Normal cardiovascular exam     Neuro/Psych Seizures -, Well Controlled,  Small R ICA aneurysm    GI/Hepatic negative GI ROS, Neg liver ROS,   Endo/Other  negative endocrine ROS  Renal/GU negative Renal ROS     Musculoskeletal   Abdominal   Peds  Hematology   Anesthesia Other Findings   Reproductive/Obstetrics                             Anesthesia Physical Anesthesia Plan  ASA: 2  Anesthesia Plan: General   Post-op Pain Management: Ofirmev IV (intra-op) and Fentanyl IV   Induction: Intravenous  PONV Risk Score and Plan: 3 and Propofol infusion, TIVA, Midazolam, Treatment may vary due to age or medical condition and Ondansetron  Airway Management Planned: Nasal Cannula and Natural Airway  Additional Equipment: None  Intra-op Plan:   Post-operative Plan:   Informed Consent:   Plan Discussed with: CRNA  Anesthesia Plan Comments:         Anesthesia Quick Evaluation

## 2021-10-23 NOTE — Op Note (Signed)
Preoperative Diagnosis:  Visually significant dermatochalasis bilateral  Upper Eyelid(s)  Postoperative Diagnosis:  Same.  Procedure(s) Performed:   Upper eyelid blepharoplasty with excess skin excision  bilateral  Upper Eyelid(s)  Surgeon: Philis Pique. Vickki Muff, M.D.  Assistants: none  Anesthesia: MAC  Specimens: None.  Estimated Blood Loss: Minimal.  Complications: None.  Operative Findings: None Dictated  Procedure:   Allergies were reviewed and the patient is allergic to Codeine, Lamictal [lamotrigine], and Sulfa antibiotics.   After the risks, benefits, complications and alternatives were discussed with the patient, appropriate informed consent was obtained and the patient was brought to the operating suite. The patient was reclined supine and a timeout was conducted.  The patient was then sedated.  Local anesthetic consisting of a 50-50 mixture of 2% lidocaine with epinephrine and 0.75% bupivacaine with added Hylenex was injected subcutaneously to both  upper eyelid(s). After adequate local was instilled, the patient was prepped and draped in the usual sterile fashion for eyelid surgery.   Attention was turned to the upper eyelids. A 83m upper eyelid crease incision line was marked with calipers on both  upper eyelid(s).  A pinch test was used to estimate the amount of excess skin to remove and this was marked in standard blepharoplasty style fashion. Attention was turned to the  right  upper eyelid. A #15 blade was used to open the premarked incision line. A Skin only flap was excised and hemostasis was obtained with bipolar cautery. A buttonhole was created medially in orbicularis and orbital septum to reveal the medial fat pocket. This was dissected free from fascial attachments, cauterized towards the pedicle base and excised to produce a nice flattening of the medial corner of the upper eyelid.  Attention was then turned to the opposite eyelid where the same procedure was  performed in the same manner. Hemostasis was obtained with bipolar cautery throughout. All incisions were then closed with a combination of running and interrupted 6-0 fast absorbing plain suture. The patient tolerated the procedure well.  Erythromycin ophthalmic ointment was applied to her incision sites, followed by ice packs. She was taken to the recovery area where she recovered without difficulty.  Post-Op Plan/Instructions:  The patient was instructed to use ice packs frequently for the next 48 hours. She was instructed to use Erythromycin ophthalmic ointment on her incisions 4 times a day for the next 12 to 14 days. She was given a prescription for tramadol (or similar) for pain control should Tylenol not be effective. [facility] [place].  Jovanni Eckhart M. FVickki Muff M.D. Ophthalmology

## 2021-10-23 NOTE — Transfer of Care (Signed)
Immediate Anesthesia Transfer of Care Note  Patient: Suzanne Hogan  Procedure(s) Performed: BLEPHAROPLASTY UPPER EYELID; E/EXCESS SKIN BILATERAL (Bilateral: Eye)  Patient Location: PACU  Anesthesia Type: General  Level of Consciousness: awake, alert  and patient cooperative  Airway and Oxygen Therapy: Patient Spontanous Breathing and Patient connected to supplemental oxygen  Post-op Assessment: Post-op Vital signs reviewed, Patient's Cardiovascular Status Stable, Respiratory Function Stable, Patent Airway and No signs of Nausea or vomiting  Post-op Vital Signs: Reviewed and stable  Complications: No notable events documented.

## 2021-11-06 ENCOUNTER — Other Ambulatory Visit: Payer: Self-pay | Admitting: Internal Medicine

## 2021-11-06 DIAGNOSIS — Z1231 Encounter for screening mammogram for malignant neoplasm of breast: Secondary | ICD-10-CM

## 2021-11-25 ENCOUNTER — Ambulatory Visit
Admission: RE | Admit: 2021-11-25 | Discharge: 2021-11-25 | Disposition: A | Discharge status: Home / Self Care | Payer: Medicare Other | Source: Ambulatory Visit | Attending: Internal Medicine | Admitting: Internal Medicine

## 2021-11-25 DIAGNOSIS — Z1231 Encounter for screening mammogram for malignant neoplasm of breast: Secondary | ICD-10-CM | POA: Insufficient documentation

## 2022-01-10 IMAGING — CT CT HEAD W/O CM
3 of 4 series · 15 of 47 positions shown, 18 images · non-contrast
Comparison: None.

CLINICAL DATA: Syncopal episode

EXAM:
CT HEAD WITHOUT CONTRAST
TECHNIQUE: Contiguous axial images were obtained from the base of the skull
through the vertex without intravenous contrast.

[Series 2: axial st head 5.00 ax · axial · 0.35mm/px · z∈[-608,-478]mm · 9 of 32 slices shown, 12 images]
[im 3/32  brain]
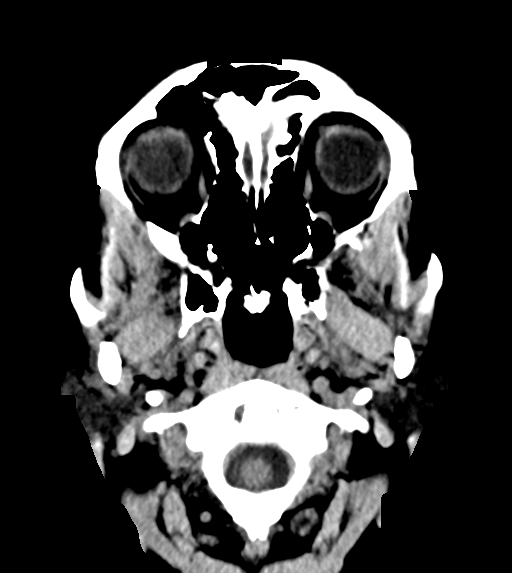
[im 3/32  bone]
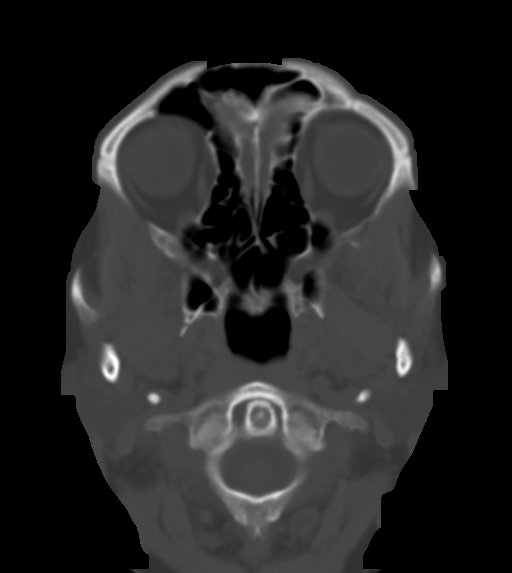
[im 7/32  brain]
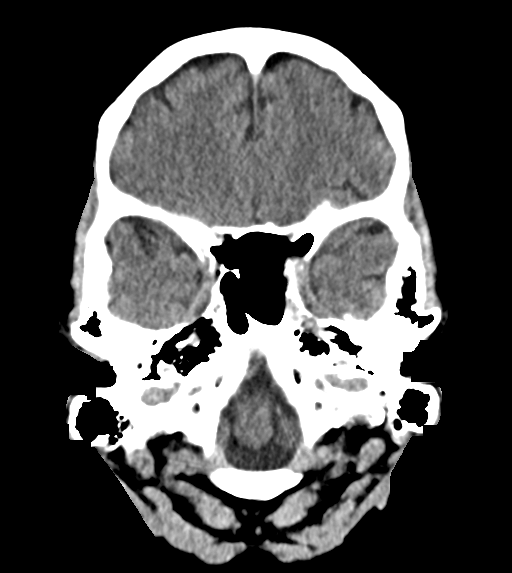
[im 9/32  brain]
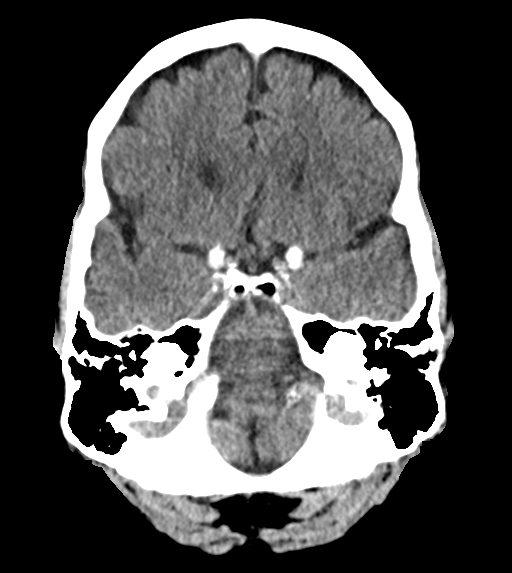
[im 14/32  brain]
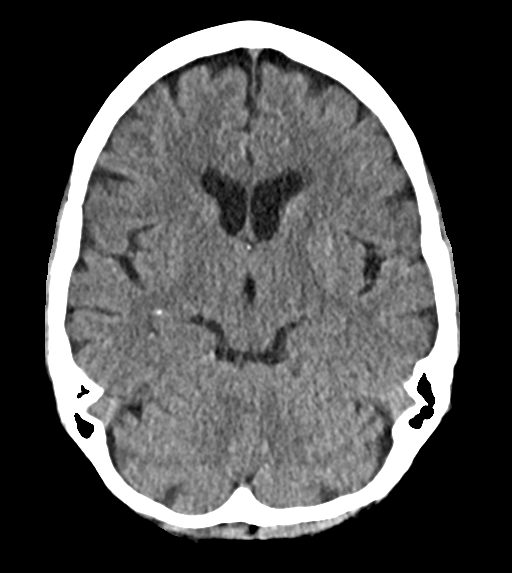
[im 16/32  brain]
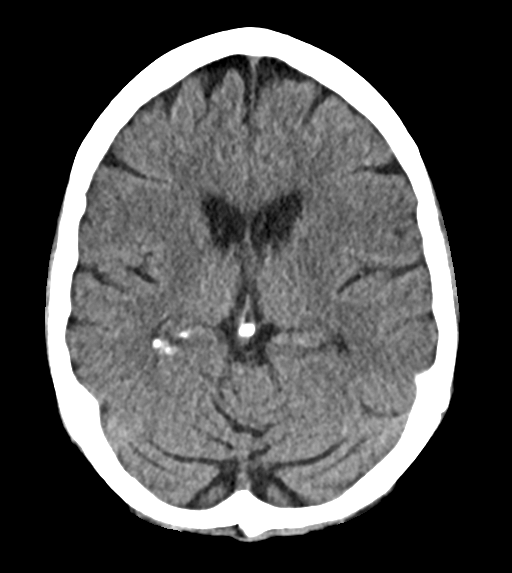
[im 16/32  bone]
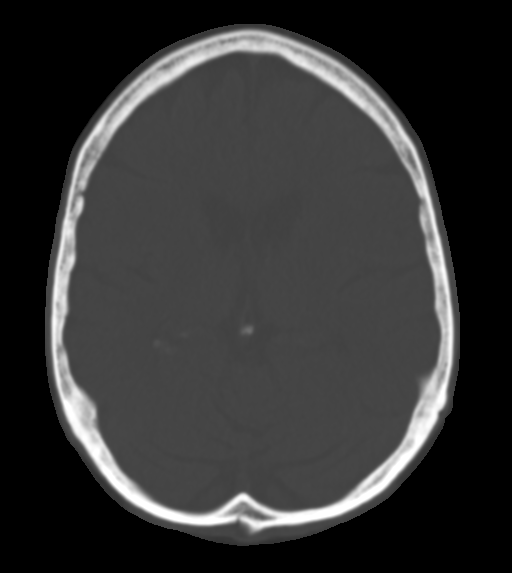
[im 18/32  brain]
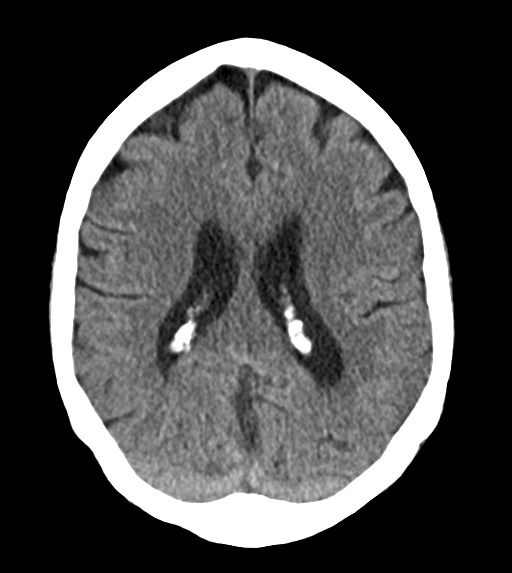
[im 23/32  brain]
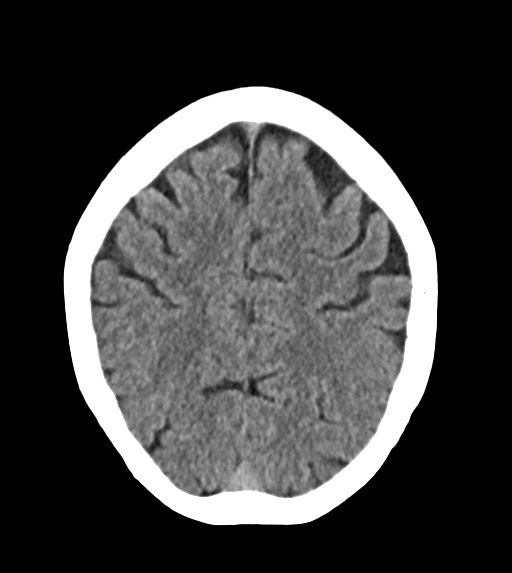
[im 25/32  brain]
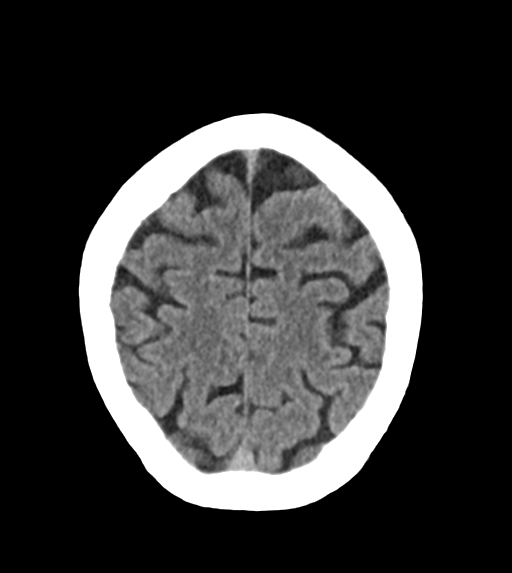
[im 29/32  brain]
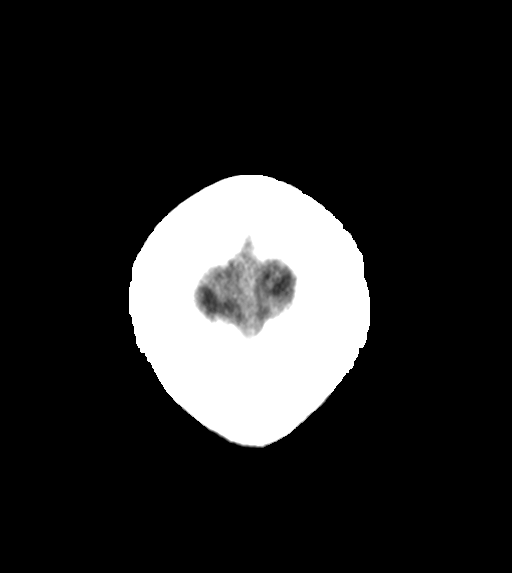
[im 29/32  bone]
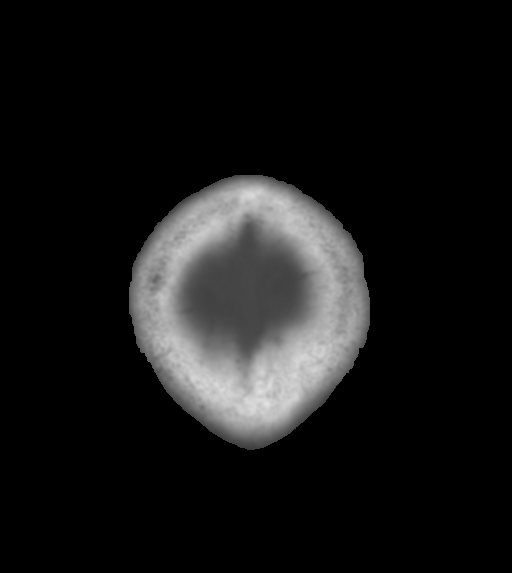

[Series 6: coronals head 3.00 cor · coronal · 0.32mm/px · 3 of 66 slices shown]
[im 22/66  brain]
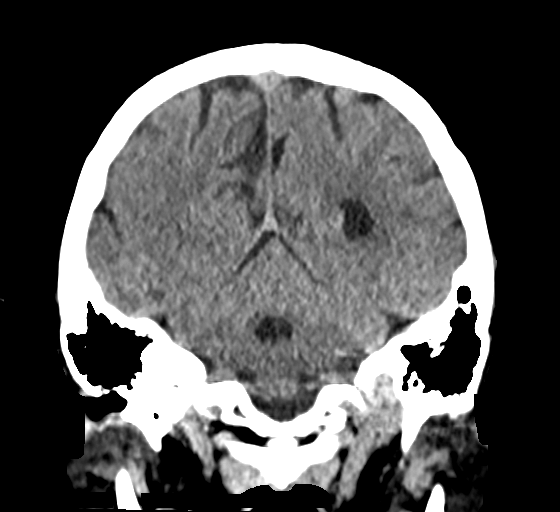
[im 29/66  brain]
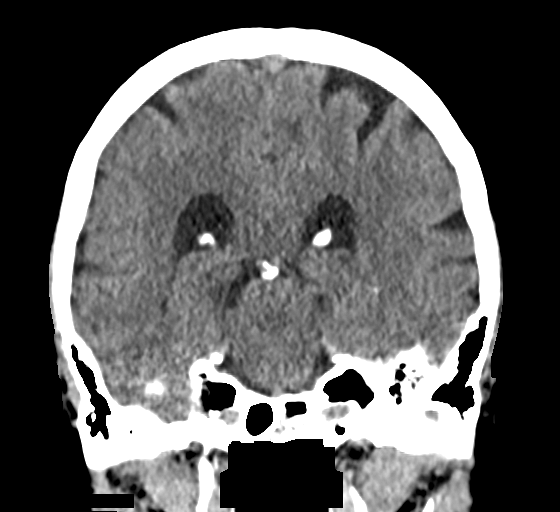
[im 37/66  brain]
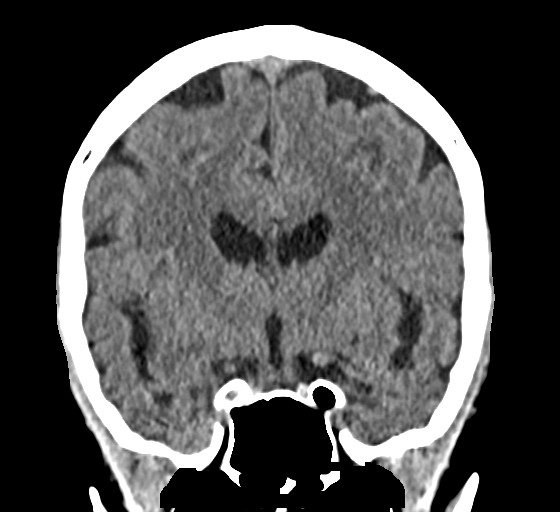

[Series 8: sagittals head 3.00 sag · sagittal · 0.32mm/px · 3 of 59 slices shown]
[im 20/59  brain]
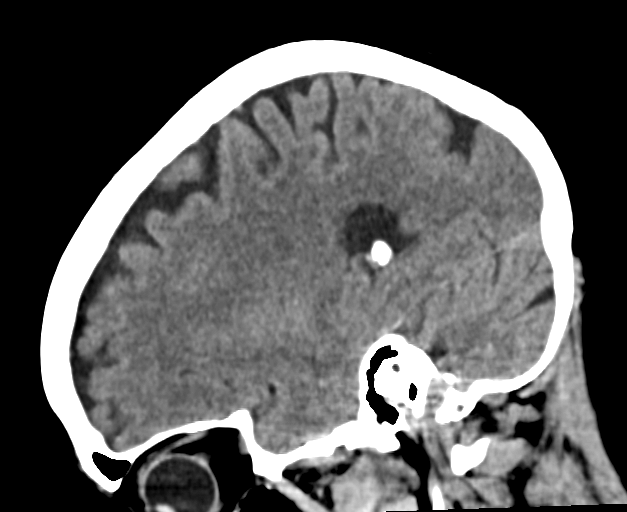
[im 30/59  brain]
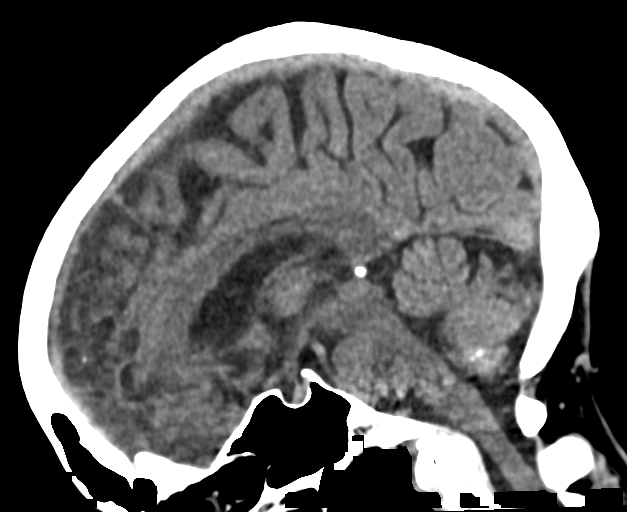
[im 39/59  brain]
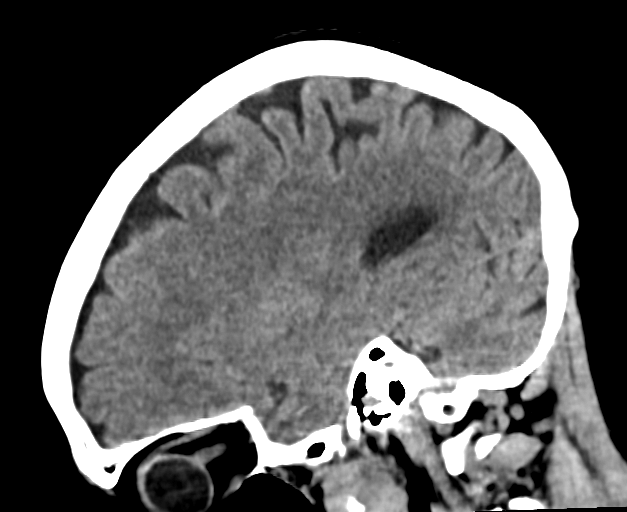

[15 of 47 positions shown; findings below may reference images not displayed]

FINDINGS: Brain: No acute territorial infarction, hemorrhage or intracranial
mass. Mild atrophy. Nonenlarged ventricles

Vascular: No hyperdense vessels.  Carotid vascular calcification

Skull: Normal. Negative for fracture or focal lesion.

Sinuses/Orbits: No acute finding.

Other: None
IMPRESSION: Negative non contrasted CT appearance of the brain for age

## 2022-10-28 ENCOUNTER — Other Ambulatory Visit: Payer: Self-pay | Admitting: Internal Medicine

## 2022-10-28 DIAGNOSIS — Z1231 Encounter for screening mammogram for malignant neoplasm of breast: Secondary | ICD-10-CM

## 2022-11-29 ENCOUNTER — Ambulatory Visit
Admission: RE | Admit: 2022-11-29 | Discharge: 2022-11-29 | Disposition: A | Discharge status: Home / Self Care | Payer: Medicare Other | Source: Ambulatory Visit | Attending: Internal Medicine | Admitting: Internal Medicine

## 2022-11-29 DIAGNOSIS — Z1231 Encounter for screening mammogram for malignant neoplasm of breast: Secondary | ICD-10-CM | POA: Insufficient documentation

## 2023-11-23 ENCOUNTER — Other Ambulatory Visit: Payer: Self-pay | Admitting: Internal Medicine

## 2023-11-23 DIAGNOSIS — Z1231 Encounter for screening mammogram for malignant neoplasm of breast: Secondary | ICD-10-CM

## 2023-12-27 ENCOUNTER — Ambulatory Visit

## 2024-01-24 ENCOUNTER — Ambulatory Visit

## 2024-02-13 ENCOUNTER — Ambulatory Visit
Admission: RE | Admit: 2024-02-13 | Discharge: 2024-02-13 | Disposition: A | Discharge status: Home / Self Care | Source: Ambulatory Visit | Attending: Internal Medicine | Admitting: Internal Medicine

## 2024-02-13 DIAGNOSIS — Z1231 Encounter for screening mammogram for malignant neoplasm of breast: Secondary | ICD-10-CM | POA: Diagnosis present
